# Patient Record
Sex: Male | Born: 1940 | Race: White | Hispanic: No | Marital: Single | Smoking: Current every day smoker
Health system: Southern US, Community
[De-identification: ages and names within clinical notes are randomized; demographics above are authoritative.]

## PROBLEM LIST (undated history)

## (undated) DIAGNOSIS — Z89619 Acquired absence of unspecified leg above knee: Secondary | ICD-10-CM

## (undated) DIAGNOSIS — H269 Unspecified cataract: Secondary | ICD-10-CM

## (undated) DIAGNOSIS — B351 Tinea unguium: Secondary | ICD-10-CM

## (undated) DIAGNOSIS — K210 Gastro-esophageal reflux disease with esophagitis: Secondary | ICD-10-CM

## (undated) DIAGNOSIS — Z95810 Presence of automatic (implantable) cardiac defibrillator: Secondary | ICD-10-CM

## (undated) DIAGNOSIS — I480 Paroxysmal atrial fibrillation: Secondary | ICD-10-CM

## (undated) DIAGNOSIS — E119 Type 2 diabetes mellitus without complications: Secondary | ICD-10-CM

## (undated) DIAGNOSIS — N189 Chronic kidney disease, unspecified: Secondary | ICD-10-CM

## (undated) DIAGNOSIS — I447 Left bundle-branch block, unspecified: Secondary | ICD-10-CM

## (undated) DIAGNOSIS — I10 Essential (primary) hypertension: Secondary | ICD-10-CM

## (undated) DIAGNOSIS — Z5181 Encounter for therapeutic drug level monitoring: Secondary | ICD-10-CM

## (undated) DIAGNOSIS — I739 Peripheral vascular disease, unspecified: Secondary | ICD-10-CM

## (undated) DIAGNOSIS — J449 Chronic obstructive pulmonary disease, unspecified: Secondary | ICD-10-CM

## (undated) DIAGNOSIS — N183 Chronic kidney disease, stage 3 (moderate): Secondary | ICD-10-CM

## (undated) DIAGNOSIS — E785 Hyperlipidemia, unspecified: Secondary | ICD-10-CM

## (undated) DIAGNOSIS — E079 Disorder of thyroid, unspecified: Secondary | ICD-10-CM

## (undated) DIAGNOSIS — E876 Hypokalemia: Secondary | ICD-10-CM

## (undated) DIAGNOSIS — I779 Disorder of arteries and arterioles, unspecified: Secondary | ICD-10-CM

## (undated) DIAGNOSIS — K219 Gastro-esophageal reflux disease without esophagitis: Secondary | ICD-10-CM

## (undated) DIAGNOSIS — M1990 Unspecified osteoarthritis, unspecified site: Secondary | ICD-10-CM

## (undated) DIAGNOSIS — I70229 Atherosclerosis of native arteries of extremities with rest pain, unspecified extremity: Secondary | ICD-10-CM

## (undated) DIAGNOSIS — I6529 Occlusion and stenosis of unspecified carotid artery: Secondary | ICD-10-CM

## (undated) DIAGNOSIS — E1142 Type 2 diabetes mellitus with diabetic polyneuropathy: Secondary | ICD-10-CM

## (undated) DIAGNOSIS — E039 Hypothyroidism, unspecified: Secondary | ICD-10-CM

## (undated) DIAGNOSIS — Z952 Presence of prosthetic heart valve: Secondary | ICD-10-CM

## (undated) DIAGNOSIS — Z7901 Long term (current) use of anticoagulants: Secondary | ICD-10-CM

## (undated) DIAGNOSIS — I25119 Atherosclerotic heart disease of native coronary artery with unspecified angina pectoris: Secondary | ICD-10-CM

## (undated) DIAGNOSIS — I130 Hypertensive heart and chronic kidney disease with heart failure and stage 1 through stage 4 chronic kidney disease, or unspecified chronic kidney disease: Secondary | ICD-10-CM

## (undated) DIAGNOSIS — I5022 Chronic systolic (congestive) heart failure: Secondary | ICD-10-CM

## (undated) DIAGNOSIS — R64 Cachexia: Secondary | ICD-10-CM

## (undated) HISTORY — DX: Chronic obstructive pulmonary disease, unspecified: J44.9

## (undated) HISTORY — DX: Atherosclerotic heart disease of native coronary artery with unspecified angina pectoris: I25.119

## (undated) HISTORY — PX: EP IMPLANTABLE DEVICE: SHX172B

## (undated) HISTORY — DX: Disorder of arteries and arterioles, unspecified: I77.9

## (undated) HISTORY — DX: Disorder of thyroid, unspecified: E07.9

## (undated) HISTORY — DX: Long term (current) use of anticoagulants: Z79.01

## (undated) HISTORY — DX: Chronic kidney disease, stage 3 (moderate): N18.3

## (undated) HISTORY — DX: Type 2 diabetes mellitus without complications: E11.9

## (undated) HISTORY — DX: Presence of prosthetic heart valve: Z95.2

## (undated) HISTORY — DX: Gastro-esophageal reflux disease without esophagitis: K21.9

## (undated) HISTORY — DX: Hypertensive heart and chronic kidney disease with heart failure and stage 1 through stage 4 chronic kidney disease, or unspecified chronic kidney disease: I13.0

## (undated) HISTORY — DX: Essential (primary) hypertension: I10

## (undated) HISTORY — DX: Left bundle-branch block, unspecified: I44.7

## (undated) HISTORY — DX: Paroxysmal atrial fibrillation: I48.0

## (undated) HISTORY — DX: Occlusion and stenosis of unspecified carotid artery: I65.29

## (undated) HISTORY — DX: Hyperlipidemia, unspecified: E78.5

## (undated) HISTORY — PX: CORONARY ARTERY BYPASS GRAFT: SHX141

## (undated) HISTORY — PX: PACEMAKER INSERTION: SHX728

## (undated) HISTORY — DX: Chronic systolic (congestive) heart failure: I50.22

## (undated) HISTORY — DX: Encounter for therapeutic drug level monitoring: Z51.81

## (undated) HISTORY — DX: Hypokalemia: E87.6

## (undated) HISTORY — DX: Type 2 diabetes mellitus with diabetic polyneuropathy: E11.42

## (undated) HISTORY — DX: Gastro-esophageal reflux disease with esophagitis: K21.0

## (undated) HISTORY — DX: Peripheral vascular disease, unspecified: I73.9

## (undated) HISTORY — DX: Chronic kidney disease, unspecified: N18.9

## (undated) HISTORY — DX: Hypothyroidism, unspecified: E03.9

## (undated) HISTORY — DX: Acquired absence of unspecified leg above knee: Z89.619

## (undated) HISTORY — DX: Atherosclerosis of native arteries of extremities with rest pain, unspecified extremity: I70.229

## (undated) HISTORY — DX: Cachexia: R64

## (undated) HISTORY — DX: Tinea unguium: B35.1

## (undated) HISTORY — DX: Unspecified osteoarthritis, unspecified site: M19.90

## (undated) HISTORY — PX: CARDIAC VALVE REPLACEMENT: SHX585

## (undated) HISTORY — DX: Unspecified cataract: H26.9

## (undated) HISTORY — DX: Presence of automatic (implantable) cardiac defibrillator: Z95.810

---

## 1997-12-14 ENCOUNTER — Inpatient Hospital Stay (HOSPITAL_COMMUNITY): Admission: AD | Admit: 1997-12-14 | Discharge: 1997-12-23 | Payer: Self-pay | Admitting: Cardiovascular Disease

## 1998-05-07 ENCOUNTER — Inpatient Hospital Stay (HOSPITAL_COMMUNITY): Admission: RE | Admit: 1998-05-07 | Discharge: 1998-05-12 | Payer: Self-pay | Admitting: *Deleted

## 1998-05-08 ENCOUNTER — Encounter: Payer: Self-pay | Admitting: *Deleted

## 1998-11-07 ENCOUNTER — Inpatient Hospital Stay (HOSPITAL_COMMUNITY): Admission: RE | Admit: 1998-11-07 | Discharge: 1998-11-16 | Payer: Self-pay | Admitting: *Deleted

## 1998-11-09 ENCOUNTER — Encounter: Payer: Self-pay | Admitting: *Deleted

## 1998-11-10 ENCOUNTER — Encounter: Payer: Self-pay | Admitting: *Deleted

## 1998-11-11 ENCOUNTER — Encounter: Payer: Self-pay | Admitting: *Deleted

## 1999-07-16 HISTORY — PX: OTHER SURGICAL HISTORY: SHX169

## 1999-07-16 HISTORY — PX: BELOW KNEE LEG AMPUTATION: SUR23

## 2004-06-01 ENCOUNTER — Ambulatory Visit: Payer: Self-pay | Admitting: Family Medicine

## 2004-07-12 ENCOUNTER — Ambulatory Visit: Payer: Self-pay | Admitting: Family Medicine

## 2004-07-26 ENCOUNTER — Ambulatory Visit: Payer: Self-pay | Admitting: Family Medicine

## 2004-08-10 ENCOUNTER — Ambulatory Visit: Payer: Self-pay | Admitting: Family Medicine

## 2004-08-24 ENCOUNTER — Ambulatory Visit: Payer: Self-pay | Admitting: Family Medicine

## 2004-09-25 ENCOUNTER — Ambulatory Visit: Payer: Self-pay | Admitting: Family Medicine

## 2004-10-10 ENCOUNTER — Ambulatory Visit: Payer: Self-pay | Admitting: Family Medicine

## 2004-11-08 ENCOUNTER — Ambulatory Visit: Payer: Self-pay | Admitting: Family Medicine

## 2004-11-22 ENCOUNTER — Ambulatory Visit: Payer: Self-pay | Admitting: Family Medicine

## 2004-12-11 ENCOUNTER — Ambulatory Visit: Payer: Self-pay | Admitting: Family Medicine

## 2004-12-31 ENCOUNTER — Ambulatory Visit: Payer: Self-pay | Admitting: Family Medicine

## 2005-01-22 ENCOUNTER — Ambulatory Visit: Payer: Self-pay | Admitting: Family Medicine

## 2005-02-12 ENCOUNTER — Ambulatory Visit: Payer: Self-pay | Admitting: Family Medicine

## 2005-02-28 ENCOUNTER — Ambulatory Visit: Payer: Self-pay | Admitting: Family Medicine

## 2005-04-01 ENCOUNTER — Ambulatory Visit: Payer: Self-pay | Admitting: Family Medicine

## 2005-04-18 ENCOUNTER — Ambulatory Visit: Payer: Self-pay | Admitting: Family Medicine

## 2005-05-02 ENCOUNTER — Ambulatory Visit: Payer: Self-pay | Admitting: Family Medicine

## 2005-05-20 ENCOUNTER — Ambulatory Visit: Payer: Self-pay | Admitting: Family Medicine

## 2005-06-03 ENCOUNTER — Ambulatory Visit: Payer: Self-pay | Admitting: Family Medicine

## 2005-06-18 ENCOUNTER — Ambulatory Visit: Payer: Self-pay | Admitting: Family Medicine

## 2005-07-03 ENCOUNTER — Ambulatory Visit: Payer: Self-pay | Admitting: Family Medicine

## 2005-07-18 ENCOUNTER — Ambulatory Visit: Payer: Self-pay | Admitting: Family Medicine

## 2005-08-14 ENCOUNTER — Ambulatory Visit: Payer: Self-pay | Admitting: Family Medicine

## 2005-09-11 ENCOUNTER — Ambulatory Visit: Payer: Self-pay | Admitting: Family Medicine

## 2005-09-25 ENCOUNTER — Ambulatory Visit: Payer: Self-pay | Admitting: Family Medicine

## 2005-10-10 ENCOUNTER — Ambulatory Visit: Payer: Self-pay | Admitting: Family Medicine

## 2009-05-09 ENCOUNTER — Ambulatory Visit: Payer: Self-pay | Admitting: Vascular Surgery

## 2010-11-27 NOTE — Procedures (Signed)
CAROTID DUPLEX EXAM   INDICATION:  History of left carotid endarterectomy with recent TIA.   HISTORY:  Diabetes:  no  Cardiac:  yes  Hypertension:  yes  Smoking:  yes  Previous Surgery:  Left carotid endarterectomy.  CV History:  no  Amaurosis Fugax No, Paresthesias No, Hemiparesis No                                       RIGHT             LEFT  Brachial systolic pressure:         150               147  Brachial Doppler waveforms:  Vertebral direction of flow:        antegrade         Not visualized  DUPLEX VELOCITIES (cm/sec)  CCA peak systolic                   80                93  ECA peak systolic                   288               518  ICA peak systolic                   125               111  ICA end diastolic                   34                31  PLAQUE MORPHOLOGY:                  heterogenous      heterogenous  PLAQUE AMOUNT:                      moderate          moderate  PLAQUE LOCATION:                    ICA/ECA           ICA/ECA   IMPRESSION:  1. 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  2. Antegrade right vertebral artery.        ___________________________________________  Quita Skye Hart [place], M.D.   MG/MEDQ  D:  05/09/2009  T:  05/10/2009  Job:  [id number]

## 2010-11-27 NOTE — Consult Note (Signed)
NEW PATIENT CONSULTATION   Craig Dixon, GELL  DOB:  14-Mar-1941                                       05/09/2009  ZOXWR#:[id number]   The patient was referred today for a history of recent left brain TIA  and a history of carotid occlusive disease.  The patient has a history  of left carotid endarterectomy performed by Dr. Liliane Bade in 1999 prior  to coronary artery bypass grafting by Dr. Kathlee Nations Trigt.  He has had  no neurologic symptoms since that time but recently had 3 episodes of  right hand and right facial weakness as well as some garbled speech.  Each of these lasted only a few minutes and were not permanent.  He was  referred for further evaluation of this.  He has no history of stroke,  amaurosis fugax or other symptoms.   PAST MEDICAL HISTORY:  1. Diabetes mellitus - stable.  2. Hypertension - stable.  3. Coronary artery disease, previous coronary artery bypass grafting -      stable.  4. History of COPD and congestive heart failure.   PAST SURGICAL HISTORY:  1. Aortic valve replacement and coronary artery bypass grafting.  2. Left carotid endarterectomy.  3. Right femoral popliteal bypass graft with Gore-Tex.  4. Right above knee amputation.   FAMILY HISTORY:  Positive for coronary artery disease in his father.  Negative for diabetes and stroke.   SOCIAL HISTORY:  He is married, has 4 children.  Smoked a pack and a  half of cigarettes a day for 50 years and does not use alcohol.   REVIEW OF SYSTEMS:  Denies any chest pain, dyspnea on exertion,  bronchitis, hemoptysis.  Does have a history of hiatal hernia,  constipation, left brain TIA as noted and a right above knee amputation.  All other systems are negative.  Please see encounter form.   ALLERGIES:  Talwin.   PHYSICAL EXAMINATION:  Vital signs:  Blood pressure 150/75, heart rate  54, respirations 14.  General:  He is an elderly chronically ill-  appearing male in no apparent distress,  alert and oriented x3.  Neck:  Supple, 3+ carotid pulses palpable.  Soft bruits are present  bilaterally.  Neurologic:  Normal.  No palpable adenopathy in the neck.  HEENT:  Exam is normal.  Chest:  Clear to auscultation.  Cardiovascular:  Regular rhythm.  No murmurs.  Abdomen:  Soft, nontender with no masses.  His right above knee amputation is well-healed.  Left leg has a 2+  femoral pulse.  No evidence of ischemia distally.   He had an MRA exam performed as well as an ultrasound performed prior to  arrival today which I have reviewed.  I have also reviewed his old  records.  I did order a repeat carotid duplex exam today to look for  ulceration of his left carotid bifurcation at the site of the previous  endarterectomy.   I interpreted that and it reveals no evidence of significant stenosis in  the left internal carotid or common carotid artery.  He does have a  severe left external carotid stenosis.  There is mild right internal  carotid flow reduction.   I do not think there is evidence to support the left carotid site as the  source of the recent TIAs.  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  If further TIAs  occur then we should consider cerebral angiography at that time but I do  not think that would be indicated at the present time.  Please let us  know if he has further specific left brain symptoms.   Quita Skye Hart [place], M.D.  Electronically Signed   JDL/MEDQ  D:  05/09/2009  T:  05/10/2009  Job:  [id number]   cc:   Elease Hashimoto A. Benedetto Goad, M.D.

## 2012-05-19 ENCOUNTER — Encounter: Payer: Self-pay | Admitting: Vascular Surgery

## 2013-02-12 HISTORY — PX: OTHER SURGICAL HISTORY: SHX169

## 2014-12-01 ENCOUNTER — Ambulatory Visit: Payer: Medicare Other | Admitting: Podiatrist

## 2014-12-19 ENCOUNTER — Ambulatory Visit: Payer: Medicare Other | Admitting: Podiatry

## 2014-12-26 ENCOUNTER — Ambulatory Visit (INDEPENDENT_AMBULATORY_CARE_PROVIDER_SITE_OTHER): Payer: Medicare Other | Admitting: Podiatry

## 2014-12-26 ENCOUNTER — Encounter: Payer: Self-pay | Admitting: Podiatry

## 2014-12-26 VITALS — BP 165/74 | HR 79 | Resp 18

## 2014-12-26 DIAGNOSIS — M79676 Pain in unspecified toe(s): Secondary | ICD-10-CM | POA: Diagnosis not present

## 2014-12-26 DIAGNOSIS — B351 Tinea unguium: Secondary | ICD-10-CM | POA: Diagnosis not present

## 2014-12-26 DIAGNOSIS — E1140 Type 2 diabetes mellitus with diabetic neuropathy, unspecified: Secondary | ICD-10-CM

## 2014-12-26 DIAGNOSIS — I739 Peripheral vascular disease, unspecified: Secondary | ICD-10-CM

## 2014-12-26 DIAGNOSIS — M79674 Pain in right toe(s): Secondary | ICD-10-CM

## 2014-12-26 NOTE — Progress Notes (Signed)
   Subjective:    Patient ID: Craig Dixon, male    DOB: May 09, 1941, 74 y.o.   MRN: [id number]  HPI i have some toenails that need to be looked at and my foot checked and i am a diabetic and i do take coumadin and my diabetes is controlled by diet and lost my right leg to vascular issues  HPI  Complaint:  Visit Type: Patient returns to my office for continued preventative foot care services. Complaint: Patient states" my nails have grown long and thick and become painful to walk and wear shoes" Patient has been diagnosed with DM with neuropathy and angiopathy.  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 He presents for preventative foot care services.   Podiatric Exam: Vascular: dorsalis pedis and posterior tibial pulses are negative. Capillary return is diminished Temperature gradient is diminished.  All these findings are left foot. Sensorium: Absent Semmes Weinstein monofilament test.   Nail Exam: Pt has thick disfigured discolored nails with subungual debris noted leftl entire nail hallux through fifth toenails Ulcer Exam: There is no evidence of ulcer or pre-ulcerative changes or infection. Orthopedic Exam: Muscle tone and strength are WNL. No limitations in general ROM. No crepitus or effusions noted. Foot type and digits show no abnormalities. Bony prominences are unremarkable. Skin: No Porokeratosis. No infection or ulcers  Diagnosis:  Tinea unguium, , pain in left toes  Treatment & Plan Procedures and Treatment: Consent by patient was obtained for treatment procedures. The patient understood the discussion of treatment and procedures well. All questions were answered thoroughly reviewed. Debridement of mycotic and hypertrophic toenails, 1 through 5 bilateral and clearing of subungual debris. No ulceration, no infection noted.  Return Visit-Office Procedure: Patient instructed to return to the office for a follow up visit 3 months for continued evaluation and  treatment.   Review of Systems  Cardiovascular:       Poor circulation and peripheral artery disease  Musculoskeletal:       Muscle pain and swelling   Skin:       Change in nails  Hematological:       Slow to heal  All other systems reviewed and are negative.      Objective:   Physical Exam        Assessment & Plan:

## 2015-01-18 DIAGNOSIS — I25119 Atherosclerotic heart disease of native coronary artery with unspecified angina pectoris: Secondary | ICD-10-CM | POA: Insufficient documentation

## 2015-01-18 DIAGNOSIS — I6529 Occlusion and stenosis of unspecified carotid artery: Secondary | ICD-10-CM

## 2015-01-18 DIAGNOSIS — N183 Chronic kidney disease, stage 3 unspecified: Secondary | ICD-10-CM

## 2015-01-18 DIAGNOSIS — I130 Hypertensive heart and chronic kidney disease with heart failure and stage 1 through stage 4 chronic kidney disease, or unspecified chronic kidney disease: Secondary | ICD-10-CM

## 2015-01-18 DIAGNOSIS — Z952 Presence of prosthetic heart valve: Secondary | ICD-10-CM

## 2015-01-18 DIAGNOSIS — I5022 Chronic systolic (congestive) heart failure: Secondary | ICD-10-CM | POA: Insufficient documentation

## 2015-01-18 HISTORY — DX: Chronic kidney disease, stage 3 unspecified: N18.30

## 2015-01-18 HISTORY — DX: Chronic systolic (congestive) heart failure: I50.22

## 2015-01-18 HISTORY — DX: Hypertensive heart and chronic kidney disease with heart failure and stage 1 through stage 4 chronic kidney disease, or unspecified chronic kidney disease: I13.0

## 2015-01-18 HISTORY — DX: Occlusion and stenosis of unspecified carotid artery: I65.29

## 2015-01-18 HISTORY — DX: Atherosclerotic heart disease of native coronary artery with unspecified angina pectoris: I25.119

## 2015-01-18 HISTORY — DX: Presence of prosthetic heart valve: Z95.2

## 2015-03-06 ENCOUNTER — Encounter: Payer: Self-pay | Admitting: Podiatry

## 2015-03-06 ENCOUNTER — Ambulatory Visit (INDEPENDENT_AMBULATORY_CARE_PROVIDER_SITE_OTHER): Payer: Medicare Other | Admitting: Podiatry

## 2015-03-06 DIAGNOSIS — B351 Tinea unguium: Secondary | ICD-10-CM

## 2015-03-06 DIAGNOSIS — M79676 Pain in unspecified toe(s): Secondary | ICD-10-CM

## 2015-03-06 DIAGNOSIS — E1140 Type 2 diabetes mellitus with diabetic neuropathy, unspecified: Secondary | ICD-10-CM

## 2015-03-06 DIAGNOSIS — M79675 Pain in left toe(s): Secondary | ICD-10-CM

## 2015-03-06 NOTE — Progress Notes (Signed)
Patient ID: Craig Dixon, male   DOB: January 07, 1941, 74 y.o.   MRN: [id number] HPI  Complaint:  Visit Type: Patient returns to my office for continued preventative foot care services. Complaint: Patient states" my nails have grown long and thick and become painful to walk and wear shoes" Patient has been diagnosed with DM with neuropathy and angiopathy,This patient  presents for preventative foot care services. No changes to ROS.  Amputation right leg noted.  Podiatric Exam: Vascular: dorsalis pedis and posterior tibial pulses are non-palpable left foot.. Capillary return is diminished. Cold feet noted.Owens Shark discolored skin left foot. Due to venous disease. Sensorium: Absent Semmes Weinstein monofilament test. .  Nail Exam: Pt has thick disfigured discolored nails with subungual debris noted left foot  entire nail hallux through fifth toenails Ulcer Exam: There is no evidence of ulcer or pre-ulcerative changes or infection. Orthopedic Exam: Muscle tone and strength are WNL. No limitations in general ROM. No crepitus or effusions noted. Foot type and digits show no abnormalities. Bony prominences are unremarkable. Skin: No Porokeratosis. No infection or ulcers.  Asymptomatic callus left feel retrocalcaneally.  Diagnosis:  Onychomycosis, pain in left toes  Treatment & Plan Procedures and Treatment: Consent by patient was obtained for treatment procedures. The patient understood the discussion of treatment and procedures well. All questions were answered thoroughly reviewed. Debridement of mycotic and hypertrophic toenails, 1 through 5 left foot and clearing of subungual debris. No ulceration, no infection noted.  Return Visit-Office Procedure: Patient instructed to return to the office for a follow up visit 3 months for continued evaluation and treatment.

## 2015-03-29 ENCOUNTER — Ambulatory Visit: Payer: Medicare Other | Admitting: Podiatry

## 2015-05-08 ENCOUNTER — Encounter: Payer: Self-pay | Admitting: Podiatry

## 2015-05-08 ENCOUNTER — Ambulatory Visit (INDEPENDENT_AMBULATORY_CARE_PROVIDER_SITE_OTHER): Payer: Medicare Other | Admitting: Podiatry

## 2015-05-08 DIAGNOSIS — I739 Peripheral vascular disease, unspecified: Secondary | ICD-10-CM

## 2015-05-08 DIAGNOSIS — M79676 Pain in unspecified toe(s): Secondary | ICD-10-CM | POA: Diagnosis not present

## 2015-05-08 DIAGNOSIS — B351 Tinea unguium: Secondary | ICD-10-CM

## 2015-05-08 DIAGNOSIS — E1140 Type 2 diabetes mellitus with diabetic neuropathy, unspecified: Secondary | ICD-10-CM

## 2015-05-08 DIAGNOSIS — M79675 Pain in left toe(s): Secondary | ICD-10-CM

## 2015-05-08 NOTE — Progress Notes (Signed)
Patient ID: Craig Dixon, male   DOB: 1940/07/25, 74 y.o.   MRN: [id number] HPI  Complaint:  Visit Type: Patient returns to my office for continued preventative foot care services. Complaint: Patient states" my nails have grown long and thick and become painful to walk and wear shoes" Patient has been diagnosed with DM with neuropathy and angiopathy,This patient  presents for preventative foot care services. No changes to ROS.  Amputation right leg noted.  Podiatric Exam: Vascular: dorsalis pedis and posterior tibial pulses are non-palpable left foot.. Capillary return is diminished. Cold feet noted.Owens Shark discolored skin left foot. Due to venous disease. Sensorium: Absent Semmes Weinstein monofilament test. .  Nail Exam: Pt has thick disfigured discolored nails with subungual debris noted left foot  entire nail hallux through fifth toenails Ulcer Exam: There is no evidence of ulcer or pre-ulcerative changes or infection. Orthopedic Exam: Muscle tone and strength are WNL. No limitations in general ROM. No crepitus or effusions noted. Foot type and digits show no abnormalities. Bony prominences are unremarkable. Skin: No Porokeratosis. No infection or ulcers.  Asymptomatic callus left feel retrocalcaneally.  Diagnosis:  Onychomycosis, pain in left toes  Treatment & Plan Procedures and Treatment: Consent by patient was obtained for treatment procedures. The patient understood the discussion of treatment and procedures well. All questions were answered thoroughly reviewed. Debridement of mycotic and hypertrophic toenails, 1 through 5 left foot and clearing of subungual debris. No ulceration, no infection noted. If this condition worsens or becomes very painful, the patient was told to contact this office or go to the Emergency Department at the hospital. Return Visit-Office Procedure: Patient instructed to return to the office for a follow up visit 3 months for continued evaluation and  treatment.

## 2015-07-12 DIAGNOSIS — I739 Peripheral vascular disease, unspecified: Secondary | ICD-10-CM

## 2015-07-12 HISTORY — DX: Peripheral vascular disease, unspecified: I73.9

## 2015-07-31 ENCOUNTER — Ambulatory Visit: Payer: Medicare Other | Admitting: Podiatry

## 2015-08-01 ENCOUNTER — Encounter: Payer: Self-pay | Admitting: Vascular Surgery

## 2015-08-02 ENCOUNTER — Telehealth: Payer: Self-pay | Admitting: Podiatry

## 2015-08-02 NOTE — Telephone Encounter (Signed)
Received call from daughter to cancel appt. No appt found

## 2015-08-09 ENCOUNTER — Ambulatory Visit (INDEPENDENT_AMBULATORY_CARE_PROVIDER_SITE_OTHER): Payer: Medicare Other | Admitting: Vascular Surgery

## 2015-08-09 ENCOUNTER — Encounter: Payer: Self-pay | Admitting: Vascular Surgery

## 2015-08-09 ENCOUNTER — Other Ambulatory Visit: Payer: Self-pay

## 2015-08-09 VITALS — BP 172/89 | HR 63 | Temp 97.5°F | Resp 16 | Ht 68.0 in | Wt 125.0 lb

## 2015-08-09 DIAGNOSIS — I70222 Atherosclerosis of native arteries of extremities with rest pain, left leg: Secondary | ICD-10-CM | POA: Diagnosis not present

## 2015-08-09 NOTE — Progress Notes (Signed)
Referred by:  Richardo Priest, MD [address] [place], [place] [address]  Reason for referral: left leg rest pain  History of Present Illness  Craig Dixon is a 75 y.o. (1940-10-09) male s/p R AKA and L CEA (Dr. Amedeo Plenty) who presents with chief complaint: left lower leg rest pain.  Onset of symptom occurred 1-2 month again.  Pain is described as aching, severity 5-10/10, and associated even with rest.  Pt wakes at night time consistently, having to lower his leg to get some relief.  This patient was referred from his cardiologist who has been longitudinally managing his PAD, as he has been lost to follow up from this practice.  Atherosclerotic risk factors include: HTN, DM, and active smoking.  Past Medical History  Diagnosis Date  . Cataract   . Arthritis   . Diabetes mellitus without complication ([place])   . Thyroid disease   . Hypertension   . Chronic kidney disease   . COPD (chronic obstructive pulmonary disease) ([place])   . GERD (gastroesophageal reflux disease)     Past Surgical History  Procedure Laterality Date  . Pace maker  Aug. 2014  . Leg ampuation  2001    right leg  . Cardiac valve replacement      Social History   Social History  . Marital Status: Single    Spouse Name: N/A  . Number of Children: N/A  . Years of Education: N/A   Occupational History  . Not on file.   Social History Main Topics  . Smoking status: Current Every Day Smoker  . Smokeless tobacco: Never Used  . Alcohol Use: No  . Drug Use: No  . Sexual Activity: Not on file   Other Topics Concern  . Not on file   Social History Narrative    Family History: patient is unable to detail the medical history of his parents   Current Outpatient Prescriptions  Medication Sig Dispense Refill  . clotrimazole (LOTRIMIN) 1 % cream Apply 1 application topically 2 (two) times daily.    Marland Kitchen COUMADIN 2.5 MG tablet Take 2.5 mg by mouth daily.  0  . COUMADIN 3 MG tablet Take 3 mg by mouth daily.  0   . polyethylene glycol powder (GLYCOLAX/MIRALAX) powder MIX 17 GRAMS (1 CAPFUL) WITH JUICE OR WATER AND DRINK ONCE DAILY AS NEEDED  2  . amitriptyline (ELAVIL) 50 MG tablet Reported on 08/09/2015  2  . COUMADIN 1 MG tablet     . COUMADIN 2 MG tablet     . furosemide (LASIX) 20 MG tablet Take 40 mg by mouth 2 (two) times daily. Reported on 08/09/2015  5  . furosemide (LASIX) 40 MG tablet Take 80 mg by mouth 2 (two) times daily. Reported on 08/09/2015  3  . HYDROcodone-acetaminophen (NORCO) 10-325 MG per tablet Reported on 08/09/2015  0  . isosorbide mononitrate (IMDUR) 60 MG 24 hr tablet Take 60 mg by mouth daily. Reported on 08/09/2015  4  . levothyroxine (SYNTHROID, LEVOTHROID) 112 MCG tablet Reported on 08/09/2015  2  . lisinopril (PRINIVIL,ZESTRIL) 5 MG tablet Reported on 08/09/2015  6  . metoprolol succinate (TOPROL-XL) 25 MG 24 hr tablet Reported on 08/09/2015  6  . mupirocin ointment (BACTROBAN) 2 %     . omeprazole (PRILOSEC) 20 MG capsule Take 40 mg by mouth daily. Reported on 08/09/2015  2  . Oxycodone HCl 10 MG TABS     . triamcinolone cream (KENALOG) 0.1 % Reported on  08/09/2015  2   No current facility-administered medications for this visit.    Allergies  Allergen Reactions  . Talwin [Pentazocine]      REVIEW OF SYSTEMS:  (Positives checked otherwise negative)  CARDIOVASCULAR:   [ ]  chest pain,  [ ]  chest pressure,  [ ]  palpitations,  [ ]  shortness of breath when laying flat,  [x]  shortness of breath with exertion,   [ ]  pain in feet when walking,  [ ]  pain in feet when laying flat, [ ]  history of blood clot in veins (DVT),  [ ]  history of phlebitis,  [ ]  swelling in legs,  [ ]  varicose veins  PULMONARY:   [ ]  productive cough,  [ ]  asthma,  [ ]  wheezing  NEUROLOGIC:   [x]  weakness in arms or legs,  [x]  numbness in arms or legs,  [ ]  difficulty speaking or slurred speech,  [ ]  temporary loss of vision in one eye,  [ ]  dizziness  HEMATOLOGIC:   [ ]  bleeding  problems,  [ ]  problems with blood clotting too easily  MUSCULOSKEL:   [ ]  joint pain, [ ]  joint swelling  GASTROINTEST:   [ ]  vomiting blood,  [ ]  blood in stool     GENITOURINARY:   [ ]  burning with urination,  [ ]  blood in urine  PSYCHIATRIC:   [ ]  history of major depression  INTEGUMENTARY:   [ ]  rashes,  [ ]  ulcers  CONSTITUTIONAL:   [ ]  fever,  [ ]  chills   For VQI Use Only  PRE-ADM LIVING: Home  AMB STATUS: Ambulatory  CAD Sx: History of MI, but no symptoms No MI within 6 months  PRIOR CHF: None  STRESS TEST: [x]  No, [ ]  Normal, [ ]  + ischemia, [ ]  + MI, [ ]  Both   Physical Examination  Filed Vitals:   08/09/15 1142 08/09/15 1154  BP: 169/79 172/89  Pulse: 65 63  Temp: 97.5 F (36.4 C)   TempSrc: Oral   Resp: 16   Height: 5\' 8"  (1.727 m)   Weight: 125 lb (56.7 kg)   SpO2: 100%    Body mass index is 19.01 kg/(m^2).  General: A&O x 3, WD, cachectic  Head: [place]/AT, Temporalis wasting,   Ear/Nose/Throat: Hearing grossly intact, nares w/o erythema or drainage, oropharynx w/o Erythema/Exudate, Mallampati score: 2  Eyes: PERRLA, EOMI  Neck: Supple, no nuchal rigidity, no palpable LAD  Pulmonary: Sym exp, good air movt, CTAB, no rales, rhonchi, & wheezing  Cardiac: RRR, Nl S1, S2, no rubs or gallops, +murmur  Vascular: Vessel Right Left  Radial Faintly Palpable Faintly Palpable  Brachial Palpable Palpable  Carotid Palpable, without bruit Palpable, with wheezing bruit  Aorta Not palpable N/A  Femoral Palpable Palpable  Popliteal AKA Not palpable  PT AKA Palpable  DP AKA Palpable   Gastrointestinal: soft, NTND, -G/R, - HSM, - masses, - CVAT B  Musculoskeletal: motor testing limited as pt wheelchair bound, ischemic toes in L foot, dependent cyanosis down to mid-foot  Neurologic: CN 2-12 intact grossly intact, Pain and light touch intact in extremities , Motor exam as listed above  Psychiatric: Judgment intact, Mood & affect appropriate  for pt's clinical situation  Dermatologic: See M/S exam for extremity exam, no rashes otherwise noted  Lymph : No Cervical, Axillary, or Inguinal lymphadenopathy    Outside Studies/Documentation 10 pages of outside documents were reviewed including: cardiology notes and outpatient LLE arterial duplex.   Medical Decision Making  Craig Dixon  Craig Dixon is a 75 y.o. 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   His LLE arterial duplex, however, suggests that this likely to be low probability of success given a long lesion in L SFA  I discussed with the patient the natural history of critical limb ischemia: 25% require amputation in one year, 50% are able to maintain their limbs in one year, and 25-30% die in one year due to comorbidities.  Given the limb threatening status of this patient, I recommend an aggressive work up including proceeding with an: Aortogram, Left runoff and possible intervention. I discussed with the patient the nature of angiographic procedures, especially the limited patencies of any endovascular intervention. The patient is aware of that the risks of an angiographic procedure include but are not limited to: bleeding, infection, access site complications, embolization, rupture of treated vessel, dissection, possible need for emergent surgical intervention, and possible need for surgical procedures to treat the patient's pathology. The patient is aware of the risks and agrees to proceed.  The procedure is scheduled for: 2 FEB 17.  I discussed in depth with the patient the nature of atherosclerosis, and emphasized the importance of maximal medical management including strict control of blood pressure, blood glucose, and lipid levels, antiplatelet agents, obtaining regular exercise, and cessation  of smoking.  The patient is aware that without maximal medical management the underlying atherosclerotic disease process will progress, limiting the benefit of any interventions. The patient is currently not on a statin:  As not medically indicated. The patient is currently not on an anti-platelet: as on anticoagulation.  Thank you for allowing [place] to participate in this patient's care.   Adele Barthel, MD [facility] Office: [phone] Pager: [phone]  08/09/2015, 2:04 PM

## 2015-08-15 ENCOUNTER — Encounter: Payer: Self-pay | Admitting: Vascular Surgery

## 2015-08-17 ENCOUNTER — Ambulatory Visit (HOSPITAL_COMMUNITY)
Admission: RE | Admit: 2015-08-17 | Discharge: 2015-08-17 | Disposition: A | Discharge status: Home / Self Care | Payer: Medicare Other | Source: Ambulatory Visit | Attending: Vascular Surgery | Admitting: Vascular Surgery

## 2015-08-17 ENCOUNTER — Encounter (HOSPITAL_COMMUNITY)
Admission: RE | Disposition: A | Discharge status: Home / Self Care | Payer: Self-pay | Source: Ambulatory Visit | Attending: Vascular Surgery

## 2015-08-17 ENCOUNTER — Encounter (HOSPITAL_COMMUNITY): Payer: Self-pay | Admitting: Vascular Surgery

## 2015-08-17 DIAGNOSIS — J449 Chronic obstructive pulmonary disease, unspecified: Secondary | ICD-10-CM | POA: Diagnosis not present

## 2015-08-17 DIAGNOSIS — Z952 Presence of prosthetic heart valve: Secondary | ICD-10-CM | POA: Insufficient documentation

## 2015-08-17 DIAGNOSIS — F17210 Nicotine dependence, cigarettes, uncomplicated: Secondary | ICD-10-CM | POA: Insufficient documentation

## 2015-08-17 DIAGNOSIS — Z89611 Acquired absence of right leg above knee: Secondary | ICD-10-CM | POA: Insufficient documentation

## 2015-08-17 DIAGNOSIS — E119 Type 2 diabetes mellitus without complications: Secondary | ICD-10-CM | POA: Diagnosis not present

## 2015-08-17 DIAGNOSIS — I70229 Atherosclerosis of native arteries of extremities with rest pain, unspecified extremity: Secondary | ICD-10-CM

## 2015-08-17 DIAGNOSIS — Z7901 Long term (current) use of anticoagulants: Secondary | ICD-10-CM | POA: Insufficient documentation

## 2015-08-17 DIAGNOSIS — K219 Gastro-esophageal reflux disease without esophagitis: Secondary | ICD-10-CM | POA: Insufficient documentation

## 2015-08-17 DIAGNOSIS — M1990 Unspecified osteoarthritis, unspecified site: Secondary | ICD-10-CM | POA: Insufficient documentation

## 2015-08-17 DIAGNOSIS — I70222 Atherosclerosis of native arteries of extremities with rest pain, left leg: Secondary | ICD-10-CM | POA: Insufficient documentation

## 2015-08-17 DIAGNOSIS — I129 Hypertensive chronic kidney disease with stage 1 through stage 4 chronic kidney disease, or unspecified chronic kidney disease: Secondary | ICD-10-CM | POA: Insufficient documentation

## 2015-08-17 DIAGNOSIS — E079 Disorder of thyroid, unspecified: Secondary | ICD-10-CM | POA: Insufficient documentation

## 2015-08-17 DIAGNOSIS — N189 Chronic kidney disease, unspecified: Secondary | ICD-10-CM | POA: Diagnosis not present

## 2015-08-17 HISTORY — DX: Atherosclerosis of native arteries of extremities with rest pain, unspecified extremity: I70.229

## 2015-08-17 HISTORY — PX: PERIPHERAL VASCULAR CATHETERIZATION: SHX172C

## 2015-08-17 LAB — POCT I-STAT, CHEM 8
BUN: 61 mg/dL — AB (ref 6–20)
CREATININE: 1.8 mg/dL — AB (ref 0.61–1.24)
Calcium, Ion: 1.18 mmol/L (ref 1.13–1.30)
Chloride: 103 mmol/L (ref 101–111)
Glucose, Bld: 129 mg/dL — ABNORMAL HIGH (ref 65–99)
HEMATOCRIT: 44 % (ref 39.0–52.0)
Hemoglobin: 15 g/dL (ref 13.0–17.0)
POTASSIUM: 4.5 mmol/L (ref 3.5–5.1)
Sodium: 138 mmol/L (ref 135–145)
TCO2: 24 mmol/L (ref 0–100)

## 2015-08-17 LAB — GLUCOSE, CAPILLARY: GLUCOSE-CAPILLARY: 112 mg/dL — AB (ref 65–99)

## 2015-08-17 LAB — PROTIME-INR
INR: 1.2 (ref 0.00–1.49)
Prothrombin Time: 15.3 seconds — ABNORMAL HIGH (ref 11.6–15.2)

## 2015-08-17 SURGERY — ABDOMINAL AORTOGRAM W/LOWER EXTREMITY

## 2015-08-17 MED ORDER — HEPARIN (PORCINE) IN NACL 2-0.9 UNIT/ML-% IJ SOLN
INTRAMUSCULAR | Status: AC
  Filled 2015-08-17: qty 1000

## 2015-08-17 MED ORDER — LIDOCAINE HCL (PF) 1 % IJ SOLN
INTRAMUSCULAR | Status: AC
  Filled 2015-08-17: qty 30

## 2015-08-17 MED ORDER — MORPHINE SULFATE (PF) 2 MG/ML IV SOLN
2.0000 mg | INTRAVENOUS | Status: DC | PRN
Start: 2015-08-17 — End: 2015-08-17

## 2015-08-17 MED ORDER — IODIXANOL 320 MG/ML IV SOLN
INTRAVENOUS | Status: DC | PRN
Start: 2015-08-17 — End: 2015-08-17
  Administered 2015-08-17: 15 mL via INTRA_ARTERIAL

## 2015-08-17 MED ORDER — LIDOCAINE HCL (PF) 1 % IJ SOLN
INTRAMUSCULAR | Status: DC | PRN
  Administered 2015-08-17: 13:00:00

## 2015-08-17 MED ORDER — LABETALOL HCL 5 MG/ML IV SOLN
INTRAVENOUS | Status: AC
  Filled 2015-08-17: qty 4

## 2015-08-17 MED ORDER — OXYCODONE-ACETAMINOPHEN 5-325 MG PO TABS: 1.0000 | ORAL_TABLET | ORAL | Status: DC | PRN

## 2015-08-17 MED ORDER — SODIUM CHLORIDE 0.9 % IV SOLN
INTRAVENOUS | Status: DC
  Administered 2015-08-17: 10:00:00 via INTRAVENOUS

## 2015-08-17 MED ORDER — LABETALOL HCL 5 MG/ML IV SOLN
10.0000 mg | INTRAVENOUS | Status: DC | PRN
  Administered 2015-08-17: 10 mg via INTRAVENOUS

## 2015-08-17 MED ORDER — MORPHINE SULFATE (PF) 2 MG/ML IV SOLN
INTRAVENOUS | Status: AC
  Administered 2015-08-17: 2 mg via INTRAVENOUS
  Filled 2015-08-17: qty 1

## 2015-08-17 MED ORDER — SODIUM CHLORIDE 0.9 % IV SOLN: 1.0000 mL/kg/h | INTRAVENOUS | Status: DC

## 2015-08-17 SURGICAL SUPPLY — 14 items
CATH OMNI FLUSH 5F 65CM (CATHETERS) ×2 IMPLANT
COVER PRB 48X5XTLSCP FOLD TPE (BAG) IMPLANT
COVER PROBE 5X48 (BAG) ×3
FILTER CO2 0.2 MICRON (VASCULAR PRODUCTS) ×2 IMPLANT
KIT MICROINTRODUCER STIFF 5F (SHEATH) ×2 IMPLANT
KIT PV (KITS) ×3 IMPLANT
RESERVOIR CO2 (VASCULAR PRODUCTS) ×2 IMPLANT
SET FLUSH CO2 (MISCELLANEOUS) ×3 IMPLANT
SHEATH PINNACLE 5F 10CM (SHEATH) ×3 IMPLANT
SYR MEDRAD MARK V 150ML (SYRINGE) ×3 IMPLANT
TRANSDUCER W/STOPCOCK (MISCELLANEOUS) ×3 IMPLANT
TRAY PV CATH (CUSTOM PROCEDURE TRAY) ×3 IMPLANT
WIRE BENTSON .035X145CM (WIRE) ×2 IMPLANT
WIRE TORQFLEX AUST .018X40CM (WIRE) ×3 IMPLANT

## 2015-08-17 NOTE — Progress Notes (Signed)
Site area: LFA Site Prior to Removal:  Level  0 Pressure Applied For:30 min Manual:   yes Patient Status During Pull:  stable Post Pull Site:  Level 0 Post Pull Instructions Given:  yes Post Pull Pulses Present: doppler Dressing Applied:  clear Bedrest begins @ 1330 till 1730 Comments:

## 2015-08-17 NOTE — Op Note (Signed)
OPERATIVE NOTE   PROCEDURE: 1.  Left common femoral artery cannulation under ultrasound guidance 2.  Placement of catheter in aorta 3.  Aortogram with carbon dioxide 4.  Left leg runoff with carbon dioxide and IV contrast  PRE-OPERATIVE DIAGNOSIS: left leg rest pain  POST-OPERATIVE DIAGNOSIS: same as above   SURGEON: Adele Barthel, MD  ANESTHESIA: conscious sedation  ESTIMATED BLOOD LOSS: 50 cc  CONTRAST: 15 cc IV contrast, 270 cc carbon dioxide   FINDING(S):  Aorta: patent, non-anatomic graft from right side of aorta to left common femoral artery   Poor quality pelvic films due to bowel gas   Left  CFA Only short segment visualized  SFA Occluded  PFA Patent, >75% stenoses in profunda femoral artery collaterals  Pop Reconstitute from profunda femoral artery collaterals  Trif Patent, poor visualized  AT Occluded  Pero Patent, only runoff  PT Occluded   SPECIMEN(S):  none  INDICATIONS:   Craig Dixon is a 75 y.o. 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  The patient presents for: aortogram, left runoff, and possible intervention.  I discussed with the patient the nature of angiographic procedures, especially the limited patencies of any endovascular intervention.  The patient is aware of that the risks of an angiographic procedure include but are not limited to: bleeding, infection, access site complications, renal failure, embolization, rupture of vessel, dissection, possible need for emergent surgical intervention, possible need for surgical procedures to treat the patient's pathology, and stroke and death.  The patient is aware of the risks and agrees to proceed.  DESCRIPTION: After full informed consent was obtained from the patient, the patient was brought back to the angiography suite.  The patient was placed supine upon the angiography table and connected to cardiopulmonary monitoring equipment.  The patient was not  given conscious sedation as I had concerns with this patient's ability to maintain his airway with sedation.  A circulating radiologic technician maintained continuous monitoring of the patient's cardiopulmonary status.  Additionally, the control room radiologic technician provided backup monitoring throughout the procedure.  The patient was prepped and drape in the standard fashion for an angiographic procedure.  At this point, attention was turned to the right groin.  I could not identify a patent right common femoral artery on this side, so I turned my attention to the left groin.  On this side, I suspected there was a graft that accounted for the pulse in this groin.  The patient was unable to confirm this.  Under ultrasound guidance, the subcutaneous tissue surrounding the graft connected to the left common femoral artery was anesthesized with 1% lidocaine with epinephrine.  The graft was then cannulated with a micropuncture needle.  The microwire was advanced into the presumed iliac arterial system.  The needle was exchanged for a microsheath, which was loaded into the graft over the wire.  The microwire was exchanged for a [facility] wire which was advanced into the aorta.  The microsheath was then exchanged for a 5-Fr sheath which was loaded into the graft.  The Omniflush catheter was then loaded over the wire up to the level of L1.  The catheter was connected to the carbon dioxide circuit.  After de-airring and de-clotting the circuit, a carbon dioxide aortogram was completed.  This verified that this was an non-anatomic aorto-unifemoral bypass graft.  I pulled the catheter down to the distal aorta and did another carbon dioxide injection.  This verified  the graft findings.  I replaced the wire into the catheter, straightening out the crook in the catheter.  Both were removed from the sheath together.  I connected the sheath to the carbon dioxide circuit.  I did the left leg runoff in station with the carbon  dioxide.  At this point, I repeated the femoral level injection with contrast.  This verified that the graft was patent, anastomosed to the left common femoral artery with direct drainage into the left profunda femoral artery.  Compromised collaterals from the profunda femoral artery are the likely etiology of his rest pain.  Unfortunately, this patient is not a surgical candidate for surgical bypass per his cardiologist, so he will have to choose between a palliative approach to his left leg vs left above-knee amputation.   COMPLICATIONS: none  CONDITION: stable   Adele Barthel, MD [facility] [place] Office: [phone] Pager: [phone]  08/17/2015, 12:36 PM

## 2015-08-17 NOTE — Progress Notes (Signed)
Pt questions when to start coumadin. I paged Dr Bridgett Larsson, no return call, called on call [facility] Dr Trula Slade, chart was reviewed,  Pt to start coumadin usual dose tonight and call his cardiologist Dr  Elam Dutch tomorrow for coumadin f/u. Pt informed and verbalized understanding.

## 2015-08-17 NOTE — Interval H&P Note (Signed)
[facility] [place]  History and Physical Update  The patient was interviewed and re-examined.  The patient's previous History and Physical has been reviewed and is unchanged from my consult.  There is no change in the plan of care: aortogram, left leg runoff, and possible left leg intervention.  Adele Barthel, MD [facility] [place] Office: [phone] Pager: [phone]  08/17/2015, 8:52 AM

## 2015-08-17 NOTE — Discharge Instructions (Signed)

## 2015-08-17 NOTE — H&P (View-Only) (Signed)
Referred by:  Richardo Priest, MD [address] [place], [place] [address]  Reason for referral: left leg rest pain  History of Present Illness  Craig Dixon is a 75 y.o. (08-13-1940) male s/p R AKA and L CEA (Dr. Amedeo Plenty) who presents with chief complaint: left lower leg rest pain.  Onset of symptom occurred 1-2 month again.  Pain is described as aching, severity 5-10/10, and associated even with rest.  Pt wakes at night time consistently, having to lower his leg to get some relief.  This patient was referred from his cardiologist who has been longitudinally managing his PAD, as he has been lost to follow up from this practice.  Atherosclerotic risk factors include: HTN, DM, and active smoking.  Past Medical History  Diagnosis Date  . Cataract   . Arthritis   . Diabetes mellitus without complication ([place])   . Thyroid disease   . Hypertension   . Chronic kidney disease   . COPD (chronic obstructive pulmonary disease) ([place])   . GERD (gastroesophageal reflux disease)     Past Surgical History  Procedure Laterality Date  . Pace maker  Aug. 2014  . Leg ampuation  2001    right leg  . Cardiac valve replacement      Social History   Social History  . Marital Status: Single    Spouse Name: N/A  . Number of Children: N/A  . Years of Education: N/A   Occupational History  . Not on file.   Social History Main Topics  . Smoking status: Current Every Day Smoker  . Smokeless tobacco: Never Used  . Alcohol Use: No  . Drug Use: No  . Sexual Activity: Not on file   Other Topics Concern  . Not on file   Social History Narrative    Family History: patient is unable to detail the medical history of his parents   Current Outpatient Prescriptions  Medication Sig Dispense Refill  . clotrimazole (LOTRIMIN) 1 % cream Apply 1 application topically 2 (two) times daily.    Marland Kitchen COUMADIN 2.5 MG tablet Take 2.5 mg by mouth daily.  0  . COUMADIN 3 MG tablet Take 3 mg by mouth daily.  0    . polyethylene glycol powder (GLYCOLAX/MIRALAX) powder MIX 17 GRAMS (1 CAPFUL) WITH JUICE OR WATER AND DRINK ONCE DAILY AS NEEDED  2  . amitriptyline (ELAVIL) 50 MG tablet Reported on 08/09/2015  2  . COUMADIN 1 MG tablet     . COUMADIN 2 MG tablet     . furosemide (LASIX) 20 MG tablet Take 40 mg by mouth 2 (two) times daily. Reported on 08/09/2015  5  . furosemide (LASIX) 40 MG tablet Take 80 mg by mouth 2 (two) times daily. Reported on 08/09/2015  3  . HYDROcodone-acetaminophen (NORCO) 10-325 MG per tablet Reported on 08/09/2015  0  . isosorbide mononitrate (IMDUR) 60 MG 24 hr tablet Take 60 mg by mouth daily. Reported on 08/09/2015  4  . levothyroxine (SYNTHROID, LEVOTHROID) 112 MCG tablet Reported on 08/09/2015  2  . lisinopril (PRINIVIL,ZESTRIL) 5 MG tablet Reported on 08/09/2015  6  . metoprolol succinate (TOPROL-XL) 25 MG 24 hr tablet Reported on 08/09/2015  6  . mupirocin ointment (BACTROBAN) 2 %     . omeprazole (PRILOSEC) 20 MG capsule Take 40 mg by mouth daily. Reported on 08/09/2015  2  . Oxycodone HCl 10 MG TABS     . triamcinolone cream (KENALOG) 0.1 % Reported  on 08/09/2015  2   No current facility-administered medications for this visit.    Allergies  Allergen Reactions  . Talwin [Pentazocine]      REVIEW OF SYSTEMS:  (Positives checked otherwise negative)  CARDIOVASCULAR:   [ ]  chest pain,  [ ]  chest pressure,  [ ]  palpitations,  [ ]  shortness of breath when laying flat,  [x]  shortness of breath with exertion,   [ ]  pain in feet when walking,  [ ]  pain in feet when laying flat, [ ]  history of blood clot in veins (DVT),  [ ]  history of phlebitis,  [ ]  swelling in legs,  [ ]  varicose veins  PULMONARY:   [ ]  productive cough,  [ ]  asthma,  [ ]  wheezing  NEUROLOGIC:   [x]  weakness in arms or legs,  [x]  numbness in arms or legs,  [ ]  difficulty speaking or slurred speech,  [ ]  temporary loss of vision in one eye,  [ ]  dizziness  HEMATOLOGIC:   [ ]  bleeding  problems,  [ ]  problems with blood clotting too easily  MUSCULOSKEL:   [ ]  joint pain, [ ]  joint swelling  GASTROINTEST:   [ ]  vomiting blood,  [ ]  blood in stool     GENITOURINARY:   [ ]  burning with urination,  [ ]  blood in urine  PSYCHIATRIC:   [ ]  history of major depression  INTEGUMENTARY:   [ ]  rashes,  [ ]  ulcers  CONSTITUTIONAL:   [ ]  fever,  [ ]  chills   For VQI Use Only  PRE-ADM LIVING: Home  AMB STATUS: Ambulatory  CAD Sx: History of MI, but no symptoms No MI within 6 months  PRIOR CHF: None  STRESS TEST: [x]  No, [ ]  Normal, [ ]  + ischemia, [ ]  + MI, [ ]  Both   Physical Examination  Filed Vitals:   08/09/15 1142 08/09/15 1154  BP: 169/79 172/89  Pulse: 65 63  Temp: 97.5 F (36.4 C)   TempSrc: Oral   Resp: 16   Height: 5\' 8"  (1.727 m)   Weight: 125 lb (56.7 kg)   SpO2: 100%    Body mass index is 19.01 kg/(m^2).  General: A&O x 3, WD, cachectic  Head: [place]/AT, Temporalis wasting,   Ear/Nose/Throat: Hearing grossly intact, nares w/o erythema or drainage, oropharynx w/o Erythema/Exudate, Mallampati score: 2  Eyes: PERRLA, EOMI  Neck: Supple, no nuchal rigidity, no palpable LAD  Pulmonary: Sym exp, good air movt, CTAB, no rales, rhonchi, & wheezing  Cardiac: RRR, Nl S1, S2, no rubs or gallops, +murmur  Vascular: Vessel Right Left  Radial Faintly Palpable Faintly Palpable  Brachial Palpable Palpable  Carotid Palpable, without bruit Palpable, with wheezing bruit  Aorta Not palpable N/A  Femoral Palpable Palpable  Popliteal AKA Not palpable  PT AKA Palpable  DP AKA Palpable   Gastrointestinal: soft, NTND, -G/R, - HSM, - masses, - CVAT B  Musculoskeletal: motor testing limited as pt wheelchair bound, ischemic toes in L foot, dependent cyanosis down to mid-foot  Neurologic: CN 2-12 intact grossly intact, Pain and light touch intact in extremities , Motor exam as listed above  Psychiatric: Judgment intact, Mood & affect appropriate  for pt's clinical situation  Dermatologic: See M/S exam for extremity exam, no rashes otherwise noted  Lymph : No Cervical, Axillary, or Inguinal lymphadenopathy    Outside Studies/Documentation 10 pages of outside documents were reviewed including: cardiology notes and outpatient LLE arterial duplex.   Medical Decision Making  Craig Dixon is a 75 y.o. 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   His LLE arterial duplex, however, suggests that this likely to be low probability of success given a long lesion in L SFA  I discussed with the patient the natural history of critical limb ischemia: 25% require amputation in one year, 50% are able to maintain their limbs in one year, and 25-30% die in one year due to comorbidities.  Given the limb threatening status of this patient, I recommend an aggressive work up including proceeding with an: Aortogram, Left runoff and possible intervention. I discussed with the patient the nature of angiographic procedures, especially the limited patencies of any endovascular intervention. The patient is aware of that the risks of an angiographic procedure include but are not limited to: bleeding, infection, access site complications, embolization, rupture of treated vessel, dissection, possible need for emergent surgical intervention, and possible need for surgical procedures to treat the patient's pathology. The patient is aware of the risks and agrees to proceed.  The procedure is scheduled for: 2 FEB 17.  I discussed in depth with the patient the nature of atherosclerosis, and emphasized the importance of maximal medical management including strict control of blood pressure, blood glucose, and lipid levels, antiplatelet agents, obtaining regular exercise, and cessation  of smoking.  The patient is aware that without maximal medical management the underlying atherosclerotic disease process will progress, limiting the benefit of any interventions. The patient is currently not on a statin:  As not medically indicated. The patient is currently not on an anti-platelet: as on anticoagulation.  Thank you for allowing [place] to participate in this patient's care.   Adele Barthel, MD [facility] [place] Office: [phone] Pager: [phone]  08/09/2015, 2:04 PM

## 2015-08-18 ENCOUNTER — Telehealth: Payer: Self-pay | Admitting: Vascular Surgery

## 2015-08-18 NOTE — Telephone Encounter (Addendum)
-----   Message from Mena Goes, RN sent at 08/17/2015  4:01 PM EST ----- Regarding: schedule   ----- Message -----    From: Conrad New Deal, MD    Sent: 08/17/2015  12:51 PM      To: [address]  KEIJI BARRERE [id number] Jul 04, 1941   PROCEDURE: 1.  Left common femoral artery cannulation under ultrasound guidance 2.  Placement of catheter in aorta 3.  Aortogram with carbon dioxide 4.  Left leg runoff with carbon dioxide and IV contrast  Follow-up: 4-6 weeks   notified patient's daughter of fu appt. with dr. Bridgett Larsson on 09-15-15 at 10:45 am

## 2015-09-07 ENCOUNTER — Encounter: Payer: Self-pay | Admitting: Vascular Surgery

## 2015-09-15 ENCOUNTER — Ambulatory Visit (INDEPENDENT_AMBULATORY_CARE_PROVIDER_SITE_OTHER): Payer: Medicare Other | Admitting: Vascular Surgery

## 2015-09-15 ENCOUNTER — Encounter: Payer: Self-pay | Admitting: Vascular Surgery

## 2015-09-15 VITALS — BP 109/55 | HR 60 | Ht 68.0 in | Wt 125.0 lb

## 2015-09-15 DIAGNOSIS — I70222 Atherosclerosis of native arteries of extremities with rest pain, left leg: Secondary | ICD-10-CM | POA: Diagnosis not present

## 2015-09-15 NOTE — Progress Notes (Signed)
Postoperative Visit   History of Present Illness  Craig Dixon is a 75 y.o. male who presents for postoperative follow-up from procedure on Date: 08/17/15 LRo.  The patient's sx are: None.  He notes recently no rest pain or wound.  The patient has decided manage his non-reconstructable disease with: palliative mgmt first but he is willing to consider amputation if necessary to salvage his life from gangrene or ascending cellulitis.  Again, his functional status is limited to wheelchair.  Past Medical History, Past Surgical History, Social History, Family History, Medications, Allergies, and Review of Systems are unchanged from previous evaluation on 08/17/15.  Current Outpatient Prescriptions  Medication Sig Dispense Refill  . amitriptyline (ELAVIL) 50 MG tablet 50 mg every evening  2  . atorvastatin (LIPITOR) 40 MG tablet Take 40 mg by mouth daily at 6 PM.     . COUMADIN 1 MG tablet Take 0.5 mg by mouth 4 (four) times a week.     Marland Kitchen COUMADIN 3 MG tablet Take 3 mg by mouth daily. 3 mg Mon, Wed, and Fri.  3.5 mg on Tues, Thurs, Sat, Sun  0  . enoxaparin (LOVENOX) 40 MG/0.4ML injection Inject 40 mg into the skin every 12 (twelve) hours.   0  . furosemide (LASIX) 40 MG tablet Take 40 mg by mouth daily. Reported on 08/09/2015  3  . HYDROcodone-acetaminophen (NORCO) 10-325 MG tablet Take 1 tablet by mouth 3 (three) times daily.    . isosorbide mononitrate (IMDUR) 60 MG 24 hr tablet Take 60 mg by mouth daily. Reported on 08/09/2015  4  . levothyroxine (SYNTHROID, LEVOTHROID) 112 MCG tablet Take 112 mcg by mouth daily before breakfast.     . lisinopril (PRINIVIL,ZESTRIL) 5 MG tablet Take 2.5 mg by mouth every Monday, Wednesday, and Friday.     . metoprolol succinate (TOPROL-XL) 25 MG 24 hr tablet Take 25 mg by mouth 4 (four) times a week. 25 mg on Tues, Thurs, Sat, and Sun    . mupirocin ointment (BACTROBAN) 2 % Apply 1 application topically daily as needed (for wound care).     Marland Kitchen omeprazole  (PRILOSEC) 20 MG capsule Take 40 mg by mouth daily. Reported on 08/09/2015  2  . Ostomy Supplies (SKIN PREP WIPES) MISC 1 application by Does not apply route 3 (three) times a week.    Marland Kitchen OVER THE COUNTER MEDICATION Apply 1 application topically daily.    . Oxycodone HCl 10 MG TABS     . polyethylene glycol powder (GLYCOLAX/MIRALAX) powder Take 17 g by mouth at bedtime as needed for mild constipation.     . Wound Dressings (SOLOSITE WOUND GEL) GEL Apply 1 application topically daily as needed (for would care).     No current facility-administered medications for this visit.    For VQI Use Only  PRE-ADM LIVING: Nursing home  AMB STATUS: Wheelchair  Physical Examination  Filed Vitals:   09/15/15 1450  BP: 109/55  Pulse: 60  Height: 5\' 8"  (1.727 m)  Weight: 125 lb (56.7 kg)  SpO2: 98%   Body mass index is 19.01 kg/(m^2).  General: A&O x 3, WD, cachectic  Pulmonary: Sym exp, good air movt, CTAB, no rales, rhonchi, & wheezing  Cardiac: RRR, Nl S1, S2, no rubs or gallops, +murmur  Vascular: Vessel Right Left  Radial Faintly Palpable Faintly Palpable  Brachial Palpable Palpable  Carotid Palpable, without bruit Palpable, with wheezing bruit  Aorta Not palpable N/A  Femoral Palpable Palpable  Popliteal AKA  Not palpable  PT AKA Palpable  DP AKA Palpable   Gastrointestinal: soft, NTND, -G/R, - HSM, - masses, - CVAT B  Musculoskeletal: motor testing limited as pt wheelchair bound, left toes look less ischemic toes, dependent cyanosis down to mid-foot  Neurologic: Pain and light touch intact in extremities , Motor exam as listed above   Medical Decision Making  Craig Dixon is a 75 y.o. male who presents s/p LRo . Based on his angiographic findings, this patient needs: palliative mgmt vs L AKA. I discussed in depth with the patient the nature of atherosclerosis, and emphasized the importance of maximal medical management including strict control  of blood pressure, blood glucose, and lipid levels, obtaining regular exercise, and cessation of smoking.  The patient is aware that without maximal medical management the underlying atherosclerotic disease process will progress, limiting the benefit of any interventions. The patient is currently on a statin: Lipitor.  The patient is currently not  on an anti-platelet due to being on Warfarin.  Feel free to reconsult [place] in the event the patient has intractable rest pain or infectious complications from the left leg.  Thank you for allowing [place] to participate in this patient's care.   Adele Barthel, MD, FACS [facility] Office: [phone] Pager: [phone]  09/15/2015, 12:27 AM

## 2016-01-25 DIAGNOSIS — Z95810 Presence of automatic (implantable) cardiac defibrillator: Secondary | ICD-10-CM

## 2016-01-25 HISTORY — DX: Presence of automatic (implantable) cardiac defibrillator: Z95.810

## 2016-02-18 DIAGNOSIS — Z89619 Acquired absence of unspecified leg above knee: Secondary | ICD-10-CM | POA: Insufficient documentation

## 2016-02-18 DIAGNOSIS — I779 Disorder of arteries and arterioles, unspecified: Secondary | ICD-10-CM

## 2016-02-18 DIAGNOSIS — B351 Tinea unguium: Secondary | ICD-10-CM

## 2016-02-18 HISTORY — DX: Tinea unguium: B35.1

## 2016-02-18 HISTORY — DX: Acquired absence of unspecified leg above knee: Z89.619

## 2016-02-18 HISTORY — DX: Disorder of arteries and arterioles, unspecified: I77.9

## 2016-12-17 DIAGNOSIS — I480 Paroxysmal atrial fibrillation: Secondary | ICD-10-CM

## 2016-12-17 DIAGNOSIS — I482 Chronic atrial fibrillation, unspecified: Secondary | ICD-10-CM | POA: Insufficient documentation

## 2016-12-17 HISTORY — DX: Paroxysmal atrial fibrillation: I48.0

## 2017-02-19 LAB — POCT INR: INR: 2.1

## 2017-02-20 ENCOUNTER — Ambulatory Visit (INDEPENDENT_AMBULATORY_CARE_PROVIDER_SITE_OTHER): Payer: Medicare Other | Admitting: Pharmacist

## 2017-02-20 DIAGNOSIS — Z7901 Long term (current) use of anticoagulants: Secondary | ICD-10-CM

## 2017-02-20 DIAGNOSIS — Z01810 Encounter for preprocedural cardiovascular examination: Secondary | ICD-10-CM | POA: Insufficient documentation

## 2017-02-20 DIAGNOSIS — Z5181 Encounter for therapeutic drug level monitoring: Secondary | ICD-10-CM

## 2017-02-20 HISTORY — DX: Encounter for therapeutic drug level monitoring: Z51.81

## 2017-02-20 HISTORY — DX: Long term (current) use of anticoagulants: Z79.01

## 2017-02-26 ENCOUNTER — Ambulatory Visit (INDEPENDENT_AMBULATORY_CARE_PROVIDER_SITE_OTHER): Payer: Medicare Other | Admitting: Pharmacist

## 2017-02-26 DIAGNOSIS — Z7901 Long term (current) use of anticoagulants: Secondary | ICD-10-CM

## 2017-02-26 DIAGNOSIS — Z5181 Encounter for therapeutic drug level monitoring: Secondary | ICD-10-CM

## 2017-02-26 LAB — POCT INR: INR: 4.6

## 2017-02-28 ENCOUNTER — Ambulatory Visit: Payer: Medicare Other | Admitting: Cardiology

## 2017-03-03 ENCOUNTER — Ambulatory Visit: Payer: Medicare Other | Admitting: Cardiology

## 2017-03-04 DIAGNOSIS — H269 Unspecified cataract: Secondary | ICD-10-CM | POA: Insufficient documentation

## 2017-03-04 DIAGNOSIS — K2100 Gastro-esophageal reflux disease with esophagitis, without bleeding: Secondary | ICD-10-CM

## 2017-03-04 DIAGNOSIS — I447 Left bundle-branch block, unspecified: Secondary | ICD-10-CM | POA: Insufficient documentation

## 2017-03-04 DIAGNOSIS — M1990 Unspecified osteoarthritis, unspecified site: Secondary | ICD-10-CM | POA: Insufficient documentation

## 2017-03-04 DIAGNOSIS — E039 Hypothyroidism, unspecified: Secondary | ICD-10-CM

## 2017-03-04 DIAGNOSIS — R64 Cachexia: Secondary | ICD-10-CM

## 2017-03-04 DIAGNOSIS — I10 Essential (primary) hypertension: Secondary | ICD-10-CM | POA: Insufficient documentation

## 2017-03-04 DIAGNOSIS — E119 Type 2 diabetes mellitus without complications: Secondary | ICD-10-CM | POA: Insufficient documentation

## 2017-03-04 DIAGNOSIS — E785 Hyperlipidemia, unspecified: Secondary | ICD-10-CM

## 2017-03-04 DIAGNOSIS — K219 Gastro-esophageal reflux disease without esophagitis: Secondary | ICD-10-CM | POA: Insufficient documentation

## 2017-03-04 DIAGNOSIS — J449 Chronic obstructive pulmonary disease, unspecified: Secondary | ICD-10-CM | POA: Insufficient documentation

## 2017-03-04 DIAGNOSIS — E876 Hypokalemia: Secondary | ICD-10-CM | POA: Insufficient documentation

## 2017-03-04 DIAGNOSIS — N189 Chronic kidney disease, unspecified: Secondary | ICD-10-CM | POA: Insufficient documentation

## 2017-03-04 DIAGNOSIS — E1142 Type 2 diabetes mellitus with diabetic polyneuropathy: Secondary | ICD-10-CM

## 2017-03-04 HISTORY — DX: Cachexia: R64

## 2017-03-04 HISTORY — DX: Left bundle-branch block, unspecified: I44.7

## 2017-03-04 HISTORY — DX: Type 2 diabetes mellitus with diabetic polyneuropathy: E11.42

## 2017-03-04 HISTORY — DX: Hypothyroidism, unspecified: E03.9

## 2017-03-04 HISTORY — DX: Hypokalemia: E87.6

## 2017-03-04 HISTORY — DX: Gastro-esophageal reflux disease with esophagitis, without bleeding: K21.00

## 2017-03-04 HISTORY — DX: Hyperlipidemia, unspecified: E78.5

## 2017-03-05 NOTE — Progress Notes (Signed)
Cardiology Office Note:    Date:  03/06/2017   ID:  Craig Dixon, DOB 04-Sep-1940, MRN [id number]  PCP:  Raelyn Number, MD  Cardiologist:  Shirlee More, MD    Referring MD: Raelyn Number, MD    ASSESSMENT:    1. Chronic systolic heart failure ([place])   2. H/O heart valve replacement with mechanical valve   3. Chronic anticoagulation   4. Coronary artery disease involving native coronary artery of native heart with angina pectoris ([place])   5. CKD (chronic kidney disease), stage III   6. 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 ICD (implantable cardioverter-defibrillator) in place   8. Hyperlipidemia, unspecified hyperlipidemia type    PLAN:    In order of problems listed above:  1. Stable compensated remarkably well with his daughters close supervision and titration of medications. He'll continue his current diuretic and minimal dose of beta blocker and ACE inhibitor for class effect. Hypotension precludes up titration to guideline directed dosage. 2. Stable continue warfarin she is concerned that there may be inaccuracy of home pro times will draw one in the office today and she'll compare with one at her house today. Goal INR is 2.5-3.5. 3. Stable continue warfarin with mechanical AVR 4. Stable continue current medical treatment anticoagulation beta blocker oral nitrates and high intensity statin. 5. Stable recheck renal function with his clinical deterioration 6. Stable continue current treatment beta blocker ACE inhibitor and diuretic 7. Stable function he's had no VT VF therapy his last device check was reviewed 8. Stable continue his statin   Next appointment: 3 months   Medication Adjustments/Labs and Tests Ordered: Current medicines are reviewed at length with the patient today.  Concerns regarding medicines are outlined above.  No orders of the defined types were placed in this encounter.  No orders of the defined  types were placed in this encounter.   Chief Complaint  Patient presents with  . Follow-up    3 month flup appt, has an AVR  . Congestive Heart Failure  . Coronary Artery Disease    History of Present Illness:    Craig Dixon is a 76 y.o. male with a hx of CAD,systolic CHF,chronic Atrial Fibrillation, Dyslipidemia, Peripheral Vascular Disease with CLI, S/P CABG, Valvular heart disease with a St Jude AVR and an ICD last seen 3 months ago.He is increasingly weak and fatigued since his narcotics were changed.Fortunately his heart failure is stable at home no edema shortness of breath he's had no chest pain palpitation or ICD discharge and presently is not having pain from his chronic limb ischemia. He was seen by his PCP yesterday and x-rays were ordered for back pain. He is also severely bothered by constipation Compliance with diet, lifestyle and medications: Yes Past Medical History:  Diagnosis Date  . Arthritis   . Atherosclerosis of native arteries of extremity with rest pain ([place]) 08/17/2015  . Cachexia ([place]) 03/04/2017  . Carotid stenosis 01/18/2015   Overview:  Severe asymptomatic L ICA stenosis  . Cataract   . Chronic anticoagulation 02/20/2017  . Chronic kidney disease   . Chronic systolic heart failure ([place]) 01/18/2015  . CKD (chronic kidney disease), stage III 01/18/2015  . COPD (chronic obstructive pulmonary disease) ([place])   . Coronary artery disease involving native coronary artery of native heart with angina pectoris ([place]) 01/18/2015  . Diabetes mellitus without complication ([place])   . Diabetic polyneuropathy associated with type 2  diabetes mellitus ([place]) 03/04/2017  . Encounter for therapeutic drug monitoring 02/20/2017  . GERD (gastroesophageal reflux disease)   . H/O heart valve replacement with mechanical valve 01/18/2015   Overview:  St Jude AVR  . History of leg amputation ([place]) 02/18/2016  . Hyperlipidemia 03/04/2017  . Hypertension   . Hypertensive heart and kidney disease with  heart failure and chronic kidney disease ([place]) 01/18/2015  . Hypokalemia 03/04/2017  . Hypothyroidism (acquired) 03/04/2017  . ICD (implantable cardioverter-defibrillator) in place 01/25/2016  . LBBB (left bundle branch block) 03/04/2017  . Onychomycosis due to dermatophyte 02/18/2016  . PAD (peripheral artery disease) ([place]) 07/12/2015   Overview:  With R AKA and LLE CLI,: Based on his angiographic findings, this patient needs: palliative mgmt vs L AKA. I discussed in depth with the patient the nature of atherosclerosis, and emphasized the importance of maximal medical management including strict control of blood pressure, blood glucose, and lipid levels, obtaining regular exercise, and cessation of smoking. The patient is aware that without maximal medical management the underlying atherosclerotic disease process will progress, limiting the benefit of any interventions.  . Paroxysmal atrial fibrillation ([place]) 12/17/2016  . Peripheral arterial occlusive disease ([place]) 02/18/2016  . Reflux esophagitis 03/04/2017  . Thyroid disease     Past Surgical History:  Procedure Laterality Date  . BELOW KNEE LEG AMPUTATION Right 2001  . CARDIAC VALVE REPLACEMENT    . CORONARY ARTERY BYPASS GRAFT    . EP IMPLANTABLE DEVICE    . leg ampuation  2001   right leg  . pace maker  Aug. 2014  . PACEMAKER INSERTION    . PERIPHERAL VASCULAR CATHETERIZATION N/A 08/17/2015   Procedure: Abdominal Aortogram w/Lower Extremity;  Surgeon: Conrad [place] Ben[place], MD;  Location: [facility];  Service: Cardiovascular;  Laterality: N/A;    Current Medications: Current Meds  Medication Sig  . AMITIZA 24 MCG capsule Take 24 mcg by mouth daily.  Marland Kitchen amitriptyline (ELAVIL) 50 MG tablet 50 mg every evening  . atorvastatin (LIPITOR) 40 MG tablet Take 40 mg by mouth daily at 6 PM.   . COUMADIN 1 MG tablet Take 1.5 mg by mouth 4 (four) times a week. 3 mg Mon, Wed, and Fri.  1.5 mg on Tues, Thurs, Sat, Sun  . COUMADIN 3 MG tablet Take 3 mg by  mouth daily. 3 mg Mon, Wed, and Fri.  1.5 mg on Tues, Thurs, Sat, Sun  . furosemide (LASIX) 40 MG tablet Take 40 mg by mouth daily. Reported on 08/09/2015  . isosorbide mononitrate (IMDUR) 60 MG 24 hr tablet Take 60 mg by mouth daily. Reported on 08/09/2015  . levothyroxine (SYNTHROID, LEVOTHROID) 112 MCG tablet Take 112 mcg by mouth daily before breakfast.   . lisinopril (PRINIVIL,ZESTRIL) 5 MG tablet Take 2.5 mg by mouth every Monday, Wednesday, and Friday.   . metoprolol succinate (TOPROL-XL) 25 MG 24 hr tablet Take 25 mg by mouth 4 (four) times a week. 25 mg on Tues, Thurs, Sat, and Sun  . mupirocin ointment (BACTROBAN) 2 % Apply 1 application topically daily as needed (for wound care).   . nitrofurantoin, macrocrystal-monohydrate, (MACROBID) 100 MG capsule TAKE 1 CAPSULE BY MOUTH EVERY TWELVE (12) HOURS WITH food FOR 5 DAYS  . OVER THE COUNTER MEDICATION Apply 1 application topically daily.  Marland Kitchen oxyCODONE-acetaminophen (PERCOCET) 10-325 MG tablet Take 1 tablet by mouth every 8 (eight) hours as needed for pain.  . polyethylene glycol powder (GLYCOLAX/MIRALAX) powder Take 17 g by mouth at  bedtime as needed for mild constipation.   . Wound Dressings (SOLOSITE WOUND GEL) GEL Apply 1 application topically daily as needed (for would care).     Allergies:   Clonidine; Gabapentin; Hydralazine; Nsaids; Talwin [pentazocine]; Tape; and Tolmetin   Social History   Social History  . Marital status: Single    Spouse name: N/A  . Number of children: N/A  . Years of education: N/A   Social History Main Topics  . Smoking status: Current Every Day Smoker    Packs/day: 1.00  . Smokeless tobacco: Never Used  . Alcohol use No  . Drug use: No  . Sexual activity: Not Asked   Other Topics Concern  . None   Social History Narrative  . None     Family History: The patient's family history includes Heart attack in his father; Heart disease in his father, sister, and son. ROS:   Please see the history  of present illness.    All other systems reviewed and are negative.  EKGs/Labs/Other Studies Reviewed:    The following studies were reviewed today:  EKG:  EKG ordered today.  The ekg ordered today demonstrates sinus bradycardia 50 BPM, QTc is normal 12/05/16 :St. Jude Medical dual chamber defibrillator Following MD: Dr. Marcene Corning: Raelyn Number, MD Referring Provider: Bosie Helper Assessment:  NORMAL DEVICE FUNCTION NO SIGNIFICANT ARRHYTHMIAS NO EVIDENCE OF FLUID RETENTION, based on current Thoracic Impedance Measures  Recently elevated but improved.  1. Chronic systolic heart failure (CMS-HCC)  2. ICD (implantable cardioverter-defibrillator) ST JUDE/ ABBOTT  3. Paroxysmal atrial fibrillation (CMS-HCC)    Recent Labs: 01/21/17: Cr 1.59 K 4.5, CBC normal INR 4.6 02/26/17 managed in coumadin clinic No results found for requested labs within last 8760 hours.  Recent Lipid Panel 11/20/16:  Chol 130, HDL 30, LDL 74 No results found for: CHOL, TRIG, HDL, CHOLHDL, VLDL, LDLCALC, LDLDIRECT  Physical Exam:    VS:  BP 122/78 (BP Location: Right Arm, Patient Position: Sitting)   Pulse 92   Ht 5\' 8"  (1.727 m)   Wt 120 lb (54.4 kg) Comment: per pts daughter  SpO2 98%   BMI 18.25 kg/m     Wt Readings from Last 3 Encounters:  03/06/17 120 lb (54.4 kg)  09/15/15 125 lb (56.7 kg)  08/17/15 125 lb (56.7 kg)     GEN:  Well nourished, well developed in no acute distress HEENT: Normal NECK: No JVD; No carotid bruits LYMPHATICS: No lymphadenopathy CARDIAC: RRR, no murmurs, rubs, gallops RESPIRATORY:  Clear to auscultation without rales, wheezing or rhonchi  ABDOMEN: Soft, non-tender, non-distended MUSCULOSKELETAL:  No edema; No deformity  SKIN: Warm and dry NEUROLOGIC:  Alert and oriented x 3 PSYCHIATRIC:  Normal affect    Signed, Shirlee More, MD  03/06/2017 11:27 AM    [facility]

## 2017-03-06 ENCOUNTER — Encounter: Payer: Self-pay | Admitting: Cardiology

## 2017-03-06 ENCOUNTER — Ambulatory Visit (INDEPENDENT_AMBULATORY_CARE_PROVIDER_SITE_OTHER): Payer: Medicare Other | Admitting: Cardiology

## 2017-03-06 ENCOUNTER — Ambulatory Visit (HOSPITAL_BASED_OUTPATIENT_CLINIC_OR_DEPARTMENT_OTHER)
Admission: RE | Admit: 2017-03-06 | Discharge: 2017-03-06 | Disposition: A | Discharge status: Home / Self Care | Payer: Medicare Other | Source: Ambulatory Visit | Attending: Family | Admitting: Family

## 2017-03-06 ENCOUNTER — Other Ambulatory Visit (HOSPITAL_BASED_OUTPATIENT_CLINIC_OR_DEPARTMENT_OTHER): Payer: Self-pay | Admitting: Family

## 2017-03-06 VITALS — BP 122/78 | HR 92 | Ht 68.0 in | Wt 120.0 lb

## 2017-03-06 DIAGNOSIS — Z7901 Long term (current) use of anticoagulants: Secondary | ICD-10-CM

## 2017-03-06 DIAGNOSIS — I25119 Atherosclerotic heart disease of native coronary artery with unspecified angina pectoris: Secondary | ICD-10-CM

## 2017-03-06 DIAGNOSIS — N183 Chronic kidney disease, stage 3 unspecified: Secondary | ICD-10-CM

## 2017-03-06 DIAGNOSIS — I998 Other disorder of circulatory system: Secondary | ICD-10-CM | POA: Insufficient documentation

## 2017-03-06 DIAGNOSIS — Z952 Presence of prosthetic heart valve: Secondary | ICD-10-CM

## 2017-03-06 DIAGNOSIS — M5440 Lumbago with sciatica, unspecified side: Secondary | ICD-10-CM | POA: Insufficient documentation

## 2017-03-06 DIAGNOSIS — M47896 Other spondylosis, lumbar region: Secondary | ICD-10-CM | POA: Diagnosis not present

## 2017-03-06 DIAGNOSIS — Z95810 Presence of automatic (implantable) cardiac defibrillator: Secondary | ICD-10-CM | POA: Diagnosis not present

## 2017-03-06 DIAGNOSIS — I70229 Atherosclerosis of native arteries of extremities with rest pain, unspecified extremity: Secondary | ICD-10-CM | POA: Insufficient documentation

## 2017-03-06 DIAGNOSIS — I130 Hypertensive heart and chronic kidney disease with heart failure and stage 1 through stage 4 chronic kidney disease, or unspecified chronic kidney disease: Secondary | ICD-10-CM

## 2017-03-06 DIAGNOSIS — I5022 Chronic systolic (congestive) heart failure: Secondary | ICD-10-CM | POA: Diagnosis not present

## 2017-03-06 DIAGNOSIS — E785 Hyperlipidemia, unspecified: Secondary | ICD-10-CM

## 2017-03-06 NOTE — Patient Instructions (Addendum)
Medication Instructions:  Your physician recommends that you continue on your current medications as directed. Please refer to the Current Medication list given to you today.   Labwork: Your physician recommends that you return for lab work in: today.   Testing/Procedures: None  Follow-Up: Your physician recommends that you schedule a follow-up appointment in: 3 months.   Any Other Special Instructions Will Be Listed Below (If Applicable).     If you need a refill on your cardiac medications before your next appointment, please call your pharmacy.    KNOW YOUR HEART FAILURE ZONES  Green Zone (Your Goal):  Marland Kitchen No shortness of breath or trouble bleeding . No weight gain of more than 3 pounds in one day or 5 pounds in a week . No swelling in your ankles, feet, stomach, or hands . No chest discomfort, heaviness or pain  Yellow Zone (Call the Doctor to get help!): Having 1 or more of the following: . Weight gain of 3 pounds in 1 day or 5 pounds in 1 week . More swelling of your feet, ankles, stomach, or hands . It is harder to breath when lying down. You need to sit up . Chest discomfort, heaviness, or pain . You feel more tired or have less energy than normal . New or worsening dizziness . Dry hacking cough . You feel uneasy and you know something is not right  Red Zone (Call 911-EMERGENCY): . Struggling to breathe. This does not go away when you sit up. . Stronger and more regular amounts of chest discomfort . New confusion or can't think clearly . Fainting or near fainting   Resources form the American heart Association: RetroDivas.ch.jsp

## 2017-03-07 ENCOUNTER — Encounter: Payer: Self-pay | Admitting: Pharmacist

## 2017-03-07 ENCOUNTER — Ambulatory Visit (INDEPENDENT_AMBULATORY_CARE_PROVIDER_SITE_OTHER): Payer: Medicare Other | Admitting: Interventional Cardiology

## 2017-03-07 DIAGNOSIS — Z7901 Long term (current) use of anticoagulants: Secondary | ICD-10-CM

## 2017-03-07 DIAGNOSIS — Z5181 Encounter for therapeutic drug level monitoring: Secondary | ICD-10-CM

## 2017-03-07 LAB — COMPREHENSIVE METABOLIC PANEL
A/G RATIO: 1.2 (ref 1.2–2.2)
ALT: 10 IU/L (ref 0–44)
AST: 19 IU/L (ref 0–40)
Albumin: 4.4 g/dL (ref 3.5–4.8)
Alkaline Phosphatase: 133 IU/L — ABNORMAL HIGH (ref 39–117)
BILIRUBIN TOTAL: 0.5 mg/dL (ref 0.0–1.2)
BUN/Creatinine Ratio: 19 (ref 10–24)
BUN: 37 mg/dL — ABNORMAL HIGH (ref 8–27)
CALCIUM: 9.8 mg/dL (ref 8.6–10.2)
CHLORIDE: 104 mmol/L (ref 96–106)
CO2: 20 mmol/L (ref 20–29)
Creatinine, Ser: 1.97 mg/dL — ABNORMAL HIGH (ref 0.76–1.27)
GFR, EST AFRICAN AMERICAN: 37 mL/min/{1.73_m2} — AB (ref >59)
GFR, EST NON AFRICAN AMERICAN: 32 mL/min/{1.73_m2} — AB (ref >59)
Globulin, Total: 3.7 g/dL (ref 1.5–4.5)
Glucose: 154 mg/dL — ABNORMAL HIGH (ref 65–99)
POTASSIUM: 4.7 mmol/L (ref 3.5–5.2)
Sodium: 140 mmol/L (ref 134–144)
TOTAL PROTEIN: 8.1 g/dL (ref 6.0–8.5)

## 2017-03-07 LAB — PROTIME-INR
INR: 5.8 — ABNORMAL HIGH (ref 0.8–1.2)
PROTHROMBIN TIME: 54.9 s — AB (ref 9.1–12.0)

## 2017-03-07 LAB — BRAIN NATRIURETIC PEPTIDE: BNP: 2037.3 pg/mL — ABNORMAL HIGH (ref 0.0–100.0)

## 2017-03-11 ENCOUNTER — Ambulatory Visit (INDEPENDENT_AMBULATORY_CARE_PROVIDER_SITE_OTHER): Payer: Medicare Other | Admitting: Cardiology

## 2017-03-11 DIAGNOSIS — Z7901 Long term (current) use of anticoagulants: Secondary | ICD-10-CM

## 2017-03-11 DIAGNOSIS — Z5181 Encounter for therapeutic drug level monitoring: Secondary | ICD-10-CM

## 2017-03-11 LAB — PROTIME-INR: INR: 5.5 — AB (ref 0.9–1.1)

## 2017-03-18 ENCOUNTER — Encounter: Payer: Self-pay | Admitting: Cardiology

## 2017-03-18 ENCOUNTER — Other Ambulatory Visit: Payer: Self-pay

## 2017-03-18 ENCOUNTER — Ambulatory Visit (INDEPENDENT_AMBULATORY_CARE_PROVIDER_SITE_OTHER): Payer: Medicare Other | Admitting: Cardiology

## 2017-03-18 DIAGNOSIS — Z952 Presence of prosthetic heart valve: Secondary | ICD-10-CM | POA: Diagnosis not present

## 2017-03-18 DIAGNOSIS — Z7901 Long term (current) use of anticoagulants: Secondary | ICD-10-CM

## 2017-03-18 DIAGNOSIS — I480 Paroxysmal atrial fibrillation: Secondary | ICD-10-CM

## 2017-03-18 DIAGNOSIS — Z5181 Encounter for therapeutic drug level monitoring: Secondary | ICD-10-CM | POA: Diagnosis not present

## 2017-03-18 LAB — POCT INR: INR: 4.5

## 2017-03-18 MED ORDER — LISINOPRIL 5 MG PO TABS: 2.5000 mg | ORAL_TABLET | ORAL | 11 refills | Status: DC

## 2017-03-18 NOTE — Progress Notes (Signed)
This encounter was created in error - please disregard.

## 2017-03-25 ENCOUNTER — Ambulatory Visit (INDEPENDENT_AMBULATORY_CARE_PROVIDER_SITE_OTHER): Payer: Medicare Other

## 2017-03-25 DIAGNOSIS — Z5181 Encounter for therapeutic drug level monitoring: Secondary | ICD-10-CM | POA: Diagnosis not present

## 2017-03-25 DIAGNOSIS — Z7901 Long term (current) use of anticoagulants: Secondary | ICD-10-CM

## 2017-03-25 LAB — POCT INR: INR: 2.3

## 2017-04-01 ENCOUNTER — Ambulatory Visit (INDEPENDENT_AMBULATORY_CARE_PROVIDER_SITE_OTHER): Payer: Medicare Other

## 2017-04-01 DIAGNOSIS — Z7901 Long term (current) use of anticoagulants: Secondary | ICD-10-CM

## 2017-04-01 DIAGNOSIS — Z5181 Encounter for therapeutic drug level monitoring: Secondary | ICD-10-CM | POA: Diagnosis not present

## 2017-04-01 DIAGNOSIS — Z952 Presence of prosthetic heart valve: Secondary | ICD-10-CM | POA: Diagnosis not present

## 2017-04-01 LAB — POCT INR: INR: 2.5

## 2017-04-04 ENCOUNTER — Ambulatory Visit (INDEPENDENT_AMBULATORY_CARE_PROVIDER_SITE_OTHER): Payer: Medicare Other | Admitting: Cardiology

## 2017-04-04 ENCOUNTER — Encounter: Payer: Self-pay | Admitting: Cardiology

## 2017-04-04 VITALS — BP 112/68 | HR 73 | Ht 68.0 in | Wt 125.0 lb

## 2017-04-04 DIAGNOSIS — Z95810 Presence of automatic (implantable) cardiac defibrillator: Secondary | ICD-10-CM

## 2017-04-04 DIAGNOSIS — Z01810 Encounter for preprocedural cardiovascular examination: Secondary | ICD-10-CM | POA: Diagnosis not present

## 2017-04-04 DIAGNOSIS — I480 Paroxysmal atrial fibrillation: Secondary | ICD-10-CM | POA: Diagnosis not present

## 2017-04-04 DIAGNOSIS — Z7901 Long term (current) use of anticoagulants: Secondary | ICD-10-CM

## 2017-04-04 DIAGNOSIS — C679 Malignant neoplasm of bladder, unspecified: Secondary | ICD-10-CM

## 2017-04-04 DIAGNOSIS — I25119 Atherosclerotic heart disease of native coronary artery with unspecified angina pectoris: Secondary | ICD-10-CM

## 2017-04-04 DIAGNOSIS — I5022 Chronic systolic (congestive) heart failure: Secondary | ICD-10-CM

## 2017-04-04 DIAGNOSIS — Z952 Presence of prosthetic heart valve: Secondary | ICD-10-CM

## 2017-04-04 NOTE — Patient Instructions (Signed)
Medication Instructions:  Your physician recommends that you continue on your current medications as directed. Please refer to the Current Medication list given to you today.  Labwork: None  Testing/Procedures: None  Follow-Up: Your physician recommends that you schedule a follow-up appointment in: keep appointment already scheduled.   Any Other Special Instructions Will Be Listed Below (If Applicable).     If you need a refill on your cardiac medications before your next appointment, please call your pharmacy.

## 2017-04-04 NOTE — Progress Notes (Signed)
Cardiology Office Note:    Date:  04/04/2017   ID:  Craig Dixon, DOB 03/10/1941, MRN [id number]  PCP:  Raelyn Number, MD  Cardiologist:  Shirlee More, MD    Referring MD: Raelyn Number, MD    ASSESSMENT:    1. Preoperative cardiovascular examination   2. Malignant neoplasm of urinary bladder, unspecified site ([place])   3. H/O heart valve replacement with mechanical valve   4. Chronic anticoagulation   5. ICD (implantable cardioverter-defibrillator) in place   6. Chronic systolic heart failure (HCC)   7. Paroxysmal atrial fibrillation (HCC)    PLAN:    In order of problems listed above:  1. He is a very high risk patient for any procedure with anesthesia because of his underlying coronary disease mechanical heart valve atrial fibrillation and anticoagulation. He is also frail near cachectic has significant COPD CK D and chronic limb ischemia. He'll require bridging anticoagulation with Lovenox preoperatively while withdrawing Coumadin and reinstituting Coumadin the night of surgery and Lovenox within 24-48 hours after surgery.he should have a procedure with the least riskof postoperative bleeding complications.He would be best suited for general anesthesia to avoid hypotension and the need for fluid loading and should be in a monitored bed postoperatively for 24 hours. despite all our endeavors he remains high risk for even minor procedures electively 2. See above he is advised to have TURB 3. Stable continue warfarin goal INR 2.5 4. Continue current anticoagulant, he will need bridging with Lovenox if surgery is performed 5. Stable intraoperatively he would need a Smart Magnet 6. Stable tenuous leak compensated continue current diuretic and cardiac medications 7. Stable continue anticoagulant and beta blocker 8. Stable after CABG I would not advise an ischemia evaluation preoperatively   Next appointment:  2 months   Medication Adjustments/Labs and Tests Ordered: Current  medicines are reviewed at length with the patient today.  Concerns regarding medicines are outlined above.  No orders of the defined types were placed in this encounter.  No orders of the defined types were placed in this encounter.   Chief Complaint  Patient presents with  . Follow-up    flup to discuss TURP procedure  . Anticoagulation    History of Present Illness:    Craig Dixon is a 76 y.o. male with a hx of CAD,systolic CHF,chronic Atrial Fibrillation, Dyslipidemia, Peripheral Vascular Disease with CLI, S/P CABG, Valvular heart disease with a St Jude AVR and an ICD and now with gross hematuria and bladder cancer advised TURB last seen one month ago.He is having gross hematuria and cysto shows a tumor. Overall he is doing better than last visit after treatment of urinary tract  Infection. He is tenuously compensated and at high risk off anticoagulants with atrial fibrillation and a St. Jude mechanical valve. Because of anticoagulation spinal anesthesia support choice and he is best suited for general anesthesia. Because of the necessity of resuming anticoagulation at most 24-48 hours after surgery he should have the least invasive procedure performed possible to allow his anticoagulants to be reintroduced.the patient is quite hesitant about surgery initially he tells me he will not have it done and then after the family had a discussion they are seeking a second opinion prior to scheduling. He would require bridging anticoagulation with Lovenox after discontinuing Coumadin 5 days before  Coumadin the night of surgery and restarting Lovenox 24-48 hours after his procedure.he has an ICD and will need a Smart magnet in the operating room. With  all of his comorbidities I feel he should be placed in a telemetry bed after surgery 24 hours of observation. Compliance with diet, lifestyle and medications: yes, he is meticulously managed by his family Past Medical History:  Diagnosis Date  .  Arthritis   . Atherosclerosis of native arteries of extremity with rest pain ([place]) 08/17/2015  . Cachexia ([place]) 03/04/2017  . Carotid stenosis 01/18/2015   Overview:  Severe asymptomatic L ICA stenosis  . Cataract   . Chronic anticoagulation 02/20/2017  . Chronic kidney disease   . Chronic systolic heart failure ([place]) 01/18/2015  . CKD (chronic kidney disease), stage III 01/18/2015  . COPD (chronic obstructive pulmonary disease) ([place])   . Coronary artery disease involving native coronary artery of native heart with angina pectoris ([place]) 01/18/2015  . Diabetes mellitus without complication ([place])   . Diabetic polyneuropathy associated with type 2 diabetes mellitus ([place]) 03/04/2017  . Encounter for therapeutic drug monitoring 02/20/2017  . GERD (gastroesophageal reflux disease)   . H/O heart valve replacement with mechanical valve 01/18/2015   Overview:  St Jude AVR  . History of leg amputation ([place]) 02/18/2016  . Hyperlipidemia 03/04/2017  . Hypertension   . 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 Hypokalemia 03/04/2017  . Hypothyroidism (acquired) 03/04/2017  . ICD (implantable cardioverter-defibrillator) in place 01/25/2016  . LBBB (left bundle branch block) 03/04/2017  . Onychomycosis due to dermatophyte 02/18/2016  . PAD (peripheral artery disease) ([place]) 07/12/2015   Overview:  With R AKA and LLE CLI,: Based on his angiographic findings, this patient needs: palliative mgmt vs L AKA. I discussed in depth with the patient the nature of atherosclerosis, and emphasized the importance of maximal medical management including strict control of blood pressure, blood glucose, and lipid levels, obtaining regular exercise, and cessation of smoking. The patient is aware that without maximal medical management the underlying atherosclerotic disease process will progress, limiting the benefit of any interventions.  . Paroxysmal atrial fibrillation ([place]) 12/17/2016    . Peripheral arterial occlusive disease ([place]) 02/18/2016  . Reflux esophagitis 03/04/2017  . Thyroid disease     Past Surgical History:  Procedure Laterality Date  . BELOW KNEE LEG AMPUTATION Right 2001  . CARDIAC VALVE REPLACEMENT    . CORONARY ARTERY BYPASS GRAFT    . EP IMPLANTABLE DEVICE    . leg ampuation  2001   right leg  . pace maker  Aug. 2014  . PACEMAKER INSERTION    . PERIPHERAL VASCULAR CATHETERIZATION N/A 08/17/2015   Procedure: Abdominal Aortogram w/Lower Extremity;  Surgeon: Conrad Azalea Park, MD;  Location: [facility];  Service: Cardiovascular;  Laterality: N/A;    Current Medications: Current Meds  Medication Sig  . amitriptyline (ELAVIL) 50 MG tablet 50 mg every evening  . atorvastatin (LIPITOR) 40 MG tablet Take 40 mg by mouth daily at 6 PM.   . COUMADIN 1 MG tablet Take 1.5 mg by mouth as directed. Currently taking 1.5mg  everyday except 1 mg on Sat  . docusate sodium (COLACE) 100 MG capsule Take 100 mg by mouth 2 (two) times daily.  . furosemide (LASIX) 40 MG tablet Take 40 mg by mouth daily. Reported on 08/09/2015  . isosorbide mononitrate (IMDUR) 60 MG 24 hr tablet Take 60 mg by mouth daily. Reported on 08/09/2015  . levothyroxine (SYNTHROID, LEVOTHROID) 112 MCG tablet Take 112 mcg by mouth daily before breakfast.   . lisinopril (PRINIVIL,ZESTRIL) 5 MG tablet Take  0.5 tablets (2.5 mg total) by mouth every Monday, Wednesday, and Friday.  . metoprolol succinate (TOPROL-XL) 25 MG 24 hr tablet Take 25 mg by mouth 4 (four) times a week. 25 mg on Tues, Thurs, Sat, and Sun  . mupirocin ointment (BACTROBAN) 2 % Apply 1 application topically daily as needed (for wound care).   . nitroGLYCERIN (NITROSTAT) 0.4 MG SL tablet DISSOLVE 1 TABLET UNDER THE TONGUE EVERY 5 MINUTES AS NEEDED FOR CHEST PAIN. (MAXIMUM OF 3 DOSES)  . OVER THE COUNTER MEDICATION Apply 1 application topically daily.  Marland Kitchen oxyCODONE-acetaminophen (PERCOCET) 10-325 MG tablet Take 1 tablet by mouth every 8  (eight) hours as needed for pain.  . polyethylene glycol powder (GLYCOLAX/MIRALAX) powder Take 17 g by mouth at bedtime as needed for mild constipation.   . Wound Dressings (SOLOSITE WOUND GEL) GEL Apply 1 application topically daily as needed (for would care).     Allergies:   Clonidine; Gabapentin; Hydralazine; Nsaids; Talwin [pentazocine]; Tape; and Tolmetin   Social History   Social History  . Marital status: Single    Spouse name: N/A  . Number of children: N/A  . Years of education: N/A   Social History Main Topics  . Smoking status: Current Every Day Smoker    Packs/day: 1.00  . Smokeless tobacco: Never Used  . Alcohol use No  . Drug use: No  . Sexual activity: Not Asked   Other Topics Concern  . None   Social History Narrative  . None     Family History: The patient's family history includes Heart attack in his father; Heart disease in his father, sister, and son. ROS:   Please see the history of present illness.    All other systems reviewed and are negative.  EKGs/Labs/Other Studies Reviewed:    The following studies were reviewed today:  Recent Labs: 03/06/2017: ALT 10; BNP 2,037.3; BUN 37; Creatinine, Ser 1.97; Potassium 4.7; Sodium 140  Recent Lipid Panel No results found for: CHOL, TRIG, HDL, CHOLHDL, VLDL, LDLCALC, LDLDIRECT  Physical Exam:    VS:  BP 112/68 (BP Location: Right Arm, Patient Position: Sitting)   Pulse 73   Ht 5\' 8"  (1.727 m)   Wt 125 lb (56.7 kg)   SpO2 96%   BMI 19.01 kg/m     Wt Readings from Last 3 Encounters:  04/04/17 125 lb (56.7 kg)  03/06/17 120 lb (54.4 kg)  09/15/15 125 lb (56.7 kg)     GEN: he is very frail and chronically ill and near cachexia. HEENT: Normal NECK: No JVD; No carotid bruits LYMPHATICS: No lymphadenopathy CARDIAC: regular rhythm sharp closing sound of St.Jude AVRNO S3 no aortic regurgitation RESPIRATORY:  Markedly diminished breath sounds no rales or wheezing ABDOMEN: Soft, non-tender,  non-distended MUSCULOSKELETAL:  No edema; No deformity  SKIN: Warm and dry NEUROLOGIC:  Alert and oriented x 3 PSYCHIATRIC:  Normal affect    Signed, Shirlee More, MD  04/04/2017 11:54 AM    [facility]

## 2017-04-08 LAB — POCT INR: INR: 2.3

## 2017-04-09 ENCOUNTER — Ambulatory Visit (INDEPENDENT_AMBULATORY_CARE_PROVIDER_SITE_OTHER): Payer: Medicare Other | Admitting: Internal Medicine

## 2017-04-09 DIAGNOSIS — Z7901 Long term (current) use of anticoagulants: Secondary | ICD-10-CM | POA: Diagnosis not present

## 2017-04-09 DIAGNOSIS — Z952 Presence of prosthetic heart valve: Secondary | ICD-10-CM | POA: Diagnosis not present

## 2017-04-09 DIAGNOSIS — Z01810 Encounter for preprocedural cardiovascular examination: Secondary | ICD-10-CM

## 2017-04-15 ENCOUNTER — Other Ambulatory Visit: Payer: Self-pay

## 2017-04-15 ENCOUNTER — Telehealth: Payer: Self-pay | Admitting: Cardiology

## 2017-04-15 ENCOUNTER — Telehealth: Payer: Self-pay

## 2017-04-15 DIAGNOSIS — R310 Gross hematuria: Secondary | ICD-10-CM | POA: Insufficient documentation

## 2017-04-15 LAB — POCT INR: INR: 2.8

## 2017-04-15 MED ORDER — LISINOPRIL 5 MG PO TABS: 2.5000 mg | ORAL_TABLET | ORAL | 11 refills | Status: DC

## 2017-04-15 NOTE — Telephone Encounter (Signed)
-----   Message from Richardo Priest, MD sent at 04/15/2017  3:17 PM EDT ----- No they are in good hands ----- Message ----- From: Stevan Born, [place]: 04/15/2017   2:24 PM To: Richardo Priest, MD  Per pts daughter, he did see Dr Venia Minks today, and he does advise that he will have surgery in the next 3 weeks.   His daughter wants to know if he will need to be seen by you again for preop clearance.  Please advise

## 2017-04-15 NOTE — Telephone Encounter (Signed)
error 

## 2017-04-15 NOTE — Telephone Encounter (Signed)
Pts daughter advised that no preop appt is needed to see Dr Bettina Gavia prior to surgery.  Pts daughter verbalized understanding.

## 2017-04-18 ENCOUNTER — Ambulatory Visit (INDEPENDENT_AMBULATORY_CARE_PROVIDER_SITE_OTHER): Payer: Medicare Other

## 2017-04-18 DIAGNOSIS — Z7901 Long term (current) use of anticoagulants: Secondary | ICD-10-CM | POA: Diagnosis not present

## 2017-04-18 DIAGNOSIS — Z01810 Encounter for preprocedural cardiovascular examination: Secondary | ICD-10-CM

## 2017-04-18 DIAGNOSIS — Z952 Presence of prosthetic heart valve: Secondary | ICD-10-CM | POA: Diagnosis not present

## 2017-04-21 ENCOUNTER — Telehealth: Payer: Self-pay | Admitting: Cardiology

## 2017-04-21 NOTE — Telephone Encounter (Signed)
Returned a call to the pt's dtr Jaceline & she stated that pt will have surgery on 05/08/17 by Dr. Venia Minks.  Pt requires a Lovenox Bridge per last Anticoagulation note. Scheduled appt with dtr to come into the office to have INR done on 04/29/17 at 1050am.

## 2017-04-21 NOTE — Telephone Encounter (Signed)
F/u Message  Pt daughter call requesting to speak with Doroteo Bradford in coumadin to f/u on pt coming into the office to get his coumadin checked. Please call back to discuss

## 2017-04-22 ENCOUNTER — Ambulatory Visit (INDEPENDENT_AMBULATORY_CARE_PROVIDER_SITE_OTHER): Payer: Medicare Other | Admitting: Interventional Cardiology

## 2017-04-22 DIAGNOSIS — Z952 Presence of prosthetic heart valve: Secondary | ICD-10-CM

## 2017-04-22 DIAGNOSIS — Z7901 Long term (current) use of anticoagulants: Secondary | ICD-10-CM

## 2017-04-22 DIAGNOSIS — Z01810 Encounter for preprocedural cardiovascular examination: Secondary | ICD-10-CM

## 2017-04-22 DIAGNOSIS — Z5181 Encounter for therapeutic drug level monitoring: Secondary | ICD-10-CM | POA: Diagnosis not present

## 2017-04-22 LAB — POCT INR: INR: 3.4

## 2017-04-29 ENCOUNTER — Ambulatory Visit (INDEPENDENT_AMBULATORY_CARE_PROVIDER_SITE_OTHER): Payer: Medicare Other

## 2017-04-29 DIAGNOSIS — Z7901 Long term (current) use of anticoagulants: Secondary | ICD-10-CM | POA: Diagnosis not present

## 2017-04-29 DIAGNOSIS — Z01810 Encounter for preprocedural cardiovascular examination: Secondary | ICD-10-CM | POA: Diagnosis not present

## 2017-04-29 DIAGNOSIS — Z952 Presence of prosthetic heart valve: Secondary | ICD-10-CM | POA: Diagnosis not present

## 2017-04-29 LAB — POCT INR: INR: 3.1

## 2017-04-29 MED ORDER — LOVENOX 60 MG/0.6ML ~~LOC~~ SOLN: 60.0000 mg | SUBCUTANEOUS | 1 refills | Status: DC

## 2017-04-29 NOTE — Patient Instructions (Addendum)
05/02/17: Last dose of Coumadin.  05/03/17: No Coumadin or Lovenox.  05/04/17: Inject Lovenox 60mg  in the fatty abdominal tissue at least 2 inches from the belly button once a day about 24 hours apart, 8pm rotate sites. No Coumadin.  05/05/17: Inject Lovenox in the fatty tissue every 24 hours, 8pm. No Coumadin.  05/06/17: Inject Lovenox in the fatty tissue every 24 hours, 8pm. No Coumadin.  05/07/17: No Lovenox. No Coumadin.  05/08/17: Procedure Day - No Lovenox - Resume Coumadin in the evening or as directed by doctor (take an extra half tablet with usual dose for 2 days then resume normal dose).  05/09/17: Resume Lovenox inject in the fatty tissue every 24 hours, 8am and take Coumadin.  05/10/17: Inject Lovenox in the fatty tissue every 24 hours, 8am and take Coumadin.  05/11/17: Inject Lovenox in the fatty tissue every 24 hours, 8am and take Coumadin.  05/12/17: Inject Lovenox in the fatty tissue every 24 hours, 8am and take Coumadin.  05/13/17: Inject Lovenox in the fatty tissue every 24 hours, 8am and take Coumadin.  05/14/17: Coumadin appt to check INR.

## 2017-05-01 ENCOUNTER — Other Ambulatory Visit: Payer: Self-pay | Admitting: Pharmacist

## 2017-05-01 MED ORDER — ENOXAPARIN SODIUM 60 MG/0.6ML ~~LOC~~ SOLN: 60.0000 mg | SUBCUTANEOUS | 1 refills | Status: DC

## 2017-05-07 ENCOUNTER — Telehealth: Payer: Self-pay | Admitting: Cardiology

## 2017-05-07 NOTE — Telephone Encounter (Signed)
Craig Dixon called to advise of Lovenox/coumadin dosing planned around procedure tomorrow morning, just an [place]. Dosed by coumadin clinic. Patient received last dose of Lovenox 10/23. No Lovenox today or tomorrow, 10/24 and 10/25. Dr. Venia Minks will determine if restarting his coumadin 10/25 pm after procedure. Plan for 10/26 and 10/27 Lovenox at 8 am and 2 mg of coumadin 10/28, 10/29, 10/30 Lovenox and 1.5 mg of coumadin 10/31 pt/inr check and determine dosing from there.

## 2017-05-07 NOTE — Telephone Encounter (Signed)
Please call patient's daughter regarding his PT and Louvenox and Coumadin after surgery.Marland KitchenMarland Kitchen

## 2017-05-10 ENCOUNTER — Telehealth: Payer: Self-pay | Admitting: Physician Assistant

## 2017-05-10 NOTE — Telephone Encounter (Signed)
Paged by answering service. Patient had a bladder cancer surgery on Thursday. He was treated with Lovenox bridge and Coumadin prior to surgery and resume on Friday morning. Noted left arm bruising since this morning. He has no pain, numbness or tingling, hematuria, melena or blood in his stool or urine. Advised to continue to observe. If worsening bruising or any sign of bleeding, the patient will call us.

## 2017-05-11 ENCOUNTER — Telehealth: Payer: Self-pay | Admitting: Physician Assistant

## 2017-05-11 NOTE — Telephone Encounter (Signed)
Daughter paged again for worsen arm bruise and new bruise at lovenox site. INR of 3.0 on home machine. Use for > 3 months. Advised to stop Lovenox and continue coumadin as directed. Check INR tomorrow and call for further instruction. Plan reviewed with Dr. Curt Bears.

## 2017-05-12 ENCOUNTER — Ambulatory Visit (INDEPENDENT_AMBULATORY_CARE_PROVIDER_SITE_OTHER): Payer: Medicare Other | Admitting: Internal Medicine

## 2017-05-12 ENCOUNTER — Telehealth: Payer: Self-pay | Admitting: Cardiology

## 2017-05-12 DIAGNOSIS — Z7901 Long term (current) use of anticoagulants: Secondary | ICD-10-CM | POA: Diagnosis not present

## 2017-05-12 DIAGNOSIS — Z01810 Encounter for preprocedural cardiovascular examination: Secondary | ICD-10-CM

## 2017-05-12 DIAGNOSIS — Z952 Presence of prosthetic heart valve: Secondary | ICD-10-CM

## 2017-05-12 LAB — POCT INR: INR: 3.4

## 2017-05-12 NOTE — Telephone Encounter (Signed)
Discusses with Dr. Bettina Gavia and advised for patient to continue coumadin and stop Lovenox. Advised to to call coumadin clinic with further dosing instructions. Jaceline verbalized understanding.

## 2017-05-12 NOTE — Telephone Encounter (Signed)
Pt had a little bit of issues with his Lovanox bridging over the weekend and talked to the doctor on call and just wanted to call and give you an update

## 2017-05-15 ENCOUNTER — Other Ambulatory Visit: Payer: Self-pay | Admitting: *Deleted

## 2017-05-15 MED ORDER — METOPROLOL SUCCINATE ER 25 MG PO TB24: 25.0000 mg | ORAL_TABLET | ORAL | 0 refills | Status: DC

## 2017-05-19 ENCOUNTER — Ambulatory Visit (INDEPENDENT_AMBULATORY_CARE_PROVIDER_SITE_OTHER): Payer: Medicare Other

## 2017-05-19 DIAGNOSIS — Z01810 Encounter for preprocedural cardiovascular examination: Secondary | ICD-10-CM

## 2017-05-19 DIAGNOSIS — Z7901 Long term (current) use of anticoagulants: Secondary | ICD-10-CM | POA: Diagnosis not present

## 2017-05-19 DIAGNOSIS — Z952 Presence of prosthetic heart valve: Secondary | ICD-10-CM | POA: Diagnosis not present

## 2017-05-19 LAB — POCT INR: INR: 7.2

## 2017-05-21 ENCOUNTER — Telehealth: Payer: Self-pay | Admitting: Cardiology

## 2017-05-21 LAB — PROTIME-INR
INR: 18.5 — AB (ref 0.9–1.1)
INR: 2.9 — AB (ref 0.9–1.1)

## 2017-05-21 NOTE — Telephone Encounter (Signed)
Received call from family member for patient, they were instructed his INR was 18.5 when checked today. No active bleeding reported. Has not had his Coumadin in 2 days and levels continue to rise. Instructed to take the patient to the ED emergently.

## 2017-05-25 LAB — POCT INR: INR: 3.3

## 2017-05-26 ENCOUNTER — Ambulatory Visit (INDEPENDENT_AMBULATORY_CARE_PROVIDER_SITE_OTHER): Payer: Medicare Other | Admitting: Internal Medicine

## 2017-05-26 DIAGNOSIS — Z7901 Long term (current) use of anticoagulants: Secondary | ICD-10-CM | POA: Diagnosis not present

## 2017-05-26 DIAGNOSIS — Z01810 Encounter for preprocedural cardiovascular examination: Secondary | ICD-10-CM

## 2017-05-26 DIAGNOSIS — Z5181 Encounter for therapeutic drug level monitoring: Secondary | ICD-10-CM

## 2017-05-26 DIAGNOSIS — I480 Paroxysmal atrial fibrillation: Secondary | ICD-10-CM | POA: Diagnosis not present

## 2017-05-26 DIAGNOSIS — Z952 Presence of prosthetic heart valve: Secondary | ICD-10-CM

## 2017-05-26 NOTE — Progress Notes (Signed)
Spoke with daughter Kennyth Lose and she states they have new strips and instructed to continue on same dose coumadin  1.5mg  daily except 1mg  on Saturday Recheck INR 1 week 06/02/2017

## 2017-06-02 ENCOUNTER — Ambulatory Visit (INDEPENDENT_AMBULATORY_CARE_PROVIDER_SITE_OTHER): Payer: Medicare Other | Admitting: Internal Medicine

## 2017-06-02 DIAGNOSIS — Z01810 Encounter for preprocedural cardiovascular examination: Secondary | ICD-10-CM

## 2017-06-02 DIAGNOSIS — Z7901 Long term (current) use of anticoagulants: Secondary | ICD-10-CM | POA: Diagnosis not present

## 2017-06-02 DIAGNOSIS — Z952 Presence of prosthetic heart valve: Secondary | ICD-10-CM | POA: Diagnosis not present

## 2017-06-02 LAB — POCT INR: INR: 3

## 2017-06-09 ENCOUNTER — Ambulatory Visit (INDEPENDENT_AMBULATORY_CARE_PROVIDER_SITE_OTHER): Payer: Medicare Other | Admitting: Internal Medicine

## 2017-06-09 ENCOUNTER — Ambulatory Visit: Payer: Medicare Other | Admitting: Cardiology

## 2017-06-09 DIAGNOSIS — Z7901 Long term (current) use of anticoagulants: Secondary | ICD-10-CM | POA: Diagnosis not present

## 2017-06-09 DIAGNOSIS — Z01810 Encounter for preprocedural cardiovascular examination: Secondary | ICD-10-CM

## 2017-06-09 DIAGNOSIS — Z952 Presence of prosthetic heart valve: Secondary | ICD-10-CM

## 2017-06-09 LAB — POCT INR: INR: 3.9

## 2017-06-09 NOTE — Progress Notes (Deleted)
Cardiology Office Note:    Date:  06/09/2017   ID:  Craig Dixon, DOB Jan 22, 1941, MRN [id number]  PCP:  Raelyn Number, MD  Cardiologist:  Shirlee More, MD    Referring MD: Raelyn Number, MD    ASSESSMENT:    1. Chronic systolic heart failure (HCC)   2. Paroxysmal atrial fibrillation ([place])   3. Chronic anticoagulation   4. H/O heart valve replacement with mechanical valve   5. Hyperlipidemia, unspecified hyperlipidemia type   6. ICD (implantable cardioverter-defibrillator) in place   7. 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 Coronary artery disease involving native coronary artery of native heart with angina pectoris (HCC)    PLAN:    In order of problems listed above:  1. ***   Next appointment: ***   Medication Adjustments/Labs and Tests Ordered: Current medicines are reviewed at length with the patient today.  Concerns regarding medicines are outlined above.  No orders of the defined types were placed in this encounter.  No orders of the defined types were placed in this encounter.   No chief complaint on file.   History of Present Illness:    Craig Dixon is a 76 y.o. male with a hx of  CAD,systolic CHF,chronic Atrial Fibrillation, Dyslipidemia, Peripheral Vascular Disease with CLI, S/P CABG, Valvular heart disease with a St Jude AVR and an ICDand bladder cancer with recent TURB  last seen 2 months ago.. Compliance with diet, lifestyle and medications: *** Past Medical History:  Diagnosis Date  . Arthritis   . Atherosclerosis of native arteries of extremity with rest pain ([place]) 08/17/2015  . Cachexia ([place]) 03/04/2017  . Carotid stenosis 01/18/2015   Overview:  Severe asymptomatic L ICA stenosis  . Cataract   . Chronic anticoagulation 02/20/2017  . Chronic kidney disease   . Chronic systolic heart failure ([place]) 01/18/2015  . CKD (chronic kidney disease), stage III 01/18/2015  . COPD (chronic obstructive  pulmonary disease) ([place])   . Coronary artery disease involving native coronary artery of native heart with angina pectoris ([place]) 01/18/2015  . Diabetes mellitus without complication ([place])   . Diabetic polyneuropathy associated with type 2 diabetes mellitus ([place]) 03/04/2017  . Encounter for therapeutic drug monitoring 02/20/2017  . GERD (gastroesophageal reflux disease)   . H/O heart valve replacement with mechanical valve 01/18/2015   Overview:  St Jude AVR  . History of leg amputation ([place]) 02/18/2016  . Hyperlipidemia 03/04/2017  . Hypertension   . 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 Hypokalemia 03/04/2017  . Hypothyroidism (acquired) 03/04/2017  . ICD (implantable cardioverter-defibrillator) in place 01/25/2016  . LBBB (left bundle branch block) 03/04/2017  . Onychomycosis due to dermatophyte 02/18/2016  . PAD (peripheral artery disease) ([place]) 07/12/2015   Overview:  With R AKA and LLE CLI,: Based on his angiographic findings, this patient needs: palliative mgmt vs L AKA. I discussed in depth with the patient the nature of atherosclerosis, and emphasized the importance of maximal medical management including strict control of blood pressure, blood glucose, and lipid levels, obtaining regular exercise, and cessation of smoking. The patient is aware that without maximal medical management the underlying atherosclerotic disease process will progress, limiting the benefit of any interventions.  . Paroxysmal atrial fibrillation ([place]) 12/17/2016  . Peripheral arterial occlusive disease ([place]) 02/18/2016  . Reflux esophagitis 03/04/2017  . Thyroid disease     Past  Surgical History:  Procedure Laterality Date  . BELOW KNEE LEG AMPUTATION Right 2001  . CARDIAC VALVE REPLACEMENT    . CORONARY ARTERY BYPASS GRAFT    . EP IMPLANTABLE DEVICE    . leg ampuation  2001   right leg  . pace maker  Aug. 2014  . PACEMAKER INSERTION    . PERIPHERAL  VASCULAR CATHETERIZATION N/A 08/17/2015   Procedure: Abdominal Aortogram w/Lower Extremity;  Surgeon: Conrad Marion, MD;  Location: [facility];  Service: Cardiovascular;  Laterality: N/A;    Current Medications: No outpatient medications have been marked as taking for the 06/09/17 encounter (Appointment) with Richardo Priest, MD.     Allergies:   Clonidine; Gabapentin; Hydralazine; Nsaids; Talwin [pentazocine]; Tape; and Tolmetin   Social History   Socioeconomic History  . Marital status: Single    Spouse name: Not on file  . Number of children: Not on file  . Years of education: Not on file  . Highest education level: Not on file  Social Needs  . Financial resource strain: Not on file  . Food insecurity - worry: Not on file  . Food insecurity - inability: Not on file  . Transportation needs - medical: Not on file  . Transportation needs - non-medical: Not on file  Occupational History  . Not on file  Tobacco Use  . Smoking status: Current Every Day Smoker    Packs/day: 1.00  . Smokeless tobacco: Never Used  Substance and Sexual Activity  . Alcohol use: No    Alcohol/week: 0.0 oz  . Drug use: No  . Sexual activity: Not on file  Other Topics Concern  . Not on file  Social History Narrative  . Not on file     Family History: The patient's ***family history includes Heart attack in his father; Heart disease in his father, sister, and son. ROS:   Please see the history of present illness.    All other systems reviewed and are negative.  EKGs/Labs/Other Studies Reviewed:    The following studies were reviewed today:  EKG:  EKG ordered today.  The ekg ordered today demonstrates ***  Recent Labs: 05/07/17: K 3.8 cr 1.69 GFR 40 cc/min, Hgb 14.9 INR 3.0 on 06/02/17 03/06/2017: ALT 10; BNP 2,037.3; BUN 37; Creatinine, Ser 1.97; Potassium 4.7; Sodium 140  Recent Lipid Panel 11/20/16: LDL 74 No results found for: CHOL, TRIG, HDL, CHOLHDL, VLDL, LDLCALC,  LDLDIRECT  Physical Exam:    VS:  There were no vitals taken for this visit.    Wt Readings from Last 3 Encounters:  04/04/17 125 lb (56.7 kg)  03/06/17 120 lb (54.4 kg)  09/15/15 125 lb (56.7 kg)     GEN: *** Well nourished, well developed in no acute distress HEENT: Normal NECK: No JVD; No carotid bruits LYMPHATICS: No lymphadenopathy CARDIAC: ***RRR, no murmurs, rubs, gallops RESPIRATORY:  Clear to auscultation without rales, wheezing or rhonchi  ABDOMEN: Soft, non-tender, non-distended MUSCULOSKELETAL:  No edema; No deformity  SKIN: Warm and dry NEUROLOGIC:  Alert and oriented x 3 PSYCHIATRIC:  Normal affect    Signed, Shirlee More, MD  06/09/2017 7:55 AM    [place]

## 2017-06-16 ENCOUNTER — Ambulatory Visit (INDEPENDENT_AMBULATORY_CARE_PROVIDER_SITE_OTHER): Payer: Medicare Other | Admitting: Cardiology

## 2017-06-16 DIAGNOSIS — Z952 Presence of prosthetic heart valve: Secondary | ICD-10-CM | POA: Diagnosis not present

## 2017-06-16 DIAGNOSIS — Z01810 Encounter for preprocedural cardiovascular examination: Secondary | ICD-10-CM

## 2017-06-16 DIAGNOSIS — Z7901 Long term (current) use of anticoagulants: Secondary | ICD-10-CM

## 2017-06-16 LAB — POCT INR: INR: 3.6

## 2017-06-25 ENCOUNTER — Ambulatory Visit (INDEPENDENT_AMBULATORY_CARE_PROVIDER_SITE_OTHER): Payer: Medicare Other

## 2017-06-25 DIAGNOSIS — Z7901 Long term (current) use of anticoagulants: Secondary | ICD-10-CM | POA: Diagnosis not present

## 2017-06-25 DIAGNOSIS — Z952 Presence of prosthetic heart valve: Secondary | ICD-10-CM

## 2017-06-25 DIAGNOSIS — Z01810 Encounter for preprocedural cardiovascular examination: Secondary | ICD-10-CM

## 2017-06-25 LAB — POCT INR: INR: 3

## 2017-06-25 NOTE — Patient Instructions (Signed)
Spoke with daughter Kennyth Lose, advised to have pt continue on same dosage 1.5mg  daily except 1mg  on Saturdays.  Recheck INR 2 weeks.

## 2017-07-09 ENCOUNTER — Ambulatory Visit (INDEPENDENT_AMBULATORY_CARE_PROVIDER_SITE_OTHER): Payer: Medicare Other | Admitting: Internal Medicine

## 2017-07-09 DIAGNOSIS — Z01810 Encounter for preprocedural cardiovascular examination: Secondary | ICD-10-CM

## 2017-07-09 DIAGNOSIS — Z952 Presence of prosthetic heart valve: Secondary | ICD-10-CM | POA: Diagnosis not present

## 2017-07-09 DIAGNOSIS — Z7901 Long term (current) use of anticoagulants: Secondary | ICD-10-CM | POA: Diagnosis not present

## 2017-07-09 LAB — POCT INR: INR: 2.6

## 2017-07-23 ENCOUNTER — Ambulatory Visit (INDEPENDENT_AMBULATORY_CARE_PROVIDER_SITE_OTHER): Payer: Medicare Other

## 2017-07-23 DIAGNOSIS — Z01810 Encounter for preprocedural cardiovascular examination: Secondary | ICD-10-CM

## 2017-07-23 DIAGNOSIS — Z7901 Long term (current) use of anticoagulants: Secondary | ICD-10-CM | POA: Diagnosis not present

## 2017-07-23 DIAGNOSIS — Z952 Presence of prosthetic heart valve: Secondary | ICD-10-CM | POA: Diagnosis not present

## 2017-07-23 LAB — POCT INR: INR: 3.3

## 2017-07-23 NOTE — Patient Instructions (Signed)
Description   Spoke with daughter Kennyth Lose, advised to have pt continue on same dosage 1.5mg  daily except 1mg  on Saturdays.  Recheck INR 2 weeks.

## 2017-07-25 NOTE — Progress Notes (Signed)
Cardiology Office Note:    Date:  07/28/2017   ID:  Craig Dixon, DOB Nov 30, 1940, MRN [id number]  PCP:  Raelyn Number, MD  Cardiologist:  Shirlee More, MD    Referring MD: Raelyn Number, MD    ASSESSMENT:    1. Chronic systolic heart failure (HCC)   2. Persistent atrial fibrillation ([place])   3. LBBB (left bundle branch block)   4. 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 Coronary artery disease involving native coronary artery of native heart with angina pectoris ([place])   6. Chronic anticoagulation   7. H/O heart valve replacement with mechanical valve   8. ICD (implantable cardioverter-defibrillator) in place   9. Hyperlipidemia, unspecified hyperlipidemia type    PLAN:    In order of problems listed above:  1. Stable he has done remarkably well considering the severity of his heart disease and comorbidities.  He will be transition from ACE inhibitor to [facility] with a 5-day gap and will continue his loop diuretic and minimum dose of beta-blocker that he tolerates hemodynamically.  He is not on MRA because of CKD and previous hyperkalemia.  Labs done last week requested from his PCP including BMP 2. Stable rate controlled continue beta-blocker 3. Stable 4. Stable blood pressure continue vasodilator beta-blocker diuretic and access recent labs done with his PCP for CKD 5. Stable continue medical treatment he is on a beta-blocker and high intensity statin 6. Stable continue warfarin goal INR is 3 7. Stable Saint Jude aortic valve 8. Stable function, referred to EP care 9. Stable continue statin await recent labs for liver function toxicity LDL efficacy with high intensity statin and CABG   Next appointment: 3 months   Medication Adjustments/Labs and Tests Ordered: Current medicines are reviewed at length with the patient today.  Concerns regarding medicines are outlined above.  No orders of the defined types were placed  in this encounter.  No orders of the defined types were placed in this encounter.   Chief Complaint  Patient presents with  . Congestive Heart Failure  . Atrial Fibrillation  . Coronary Artery Disease  . Anticoagulation    with St Jude AVR    History of Present Illness:    Craig Dixon is a 77 y.o. male with a hx of CAD,systolic CHF,chronic atrial Fibrillation, Dyslipidemia, Peripheral Vascular Disease with CLI, S/P CABG, Valvular heart disease with a St Jude AVR and an ICD last seen in September 2018 before GU surgery for bladder cancer. Recently had device alert with sustained atrial fibrillation. Compliance with diet, lifestyle and medications: Yes He is really doing quite well he had his urologic procedures without complication has had no further hematuria.  His daughter meticulously supervises and manages him he has had no edema shortness of breath chest pain palpitations syncope or TIA.  He received a phone call that he is having atrial fibrillation device check I reviewed my records and is my impression he is in persistent or chronic A. fib is his baseline rhythm.  They request to relocate his device and EP care to my practice consultation was made.  Warfarin is managed by Coumadin clinic Past Medical History:  Diagnosis Date  . Arthritis   . Atherosclerosis of native arteries of extremity with rest pain ([place]) 08/17/2015  . Cachexia ([place]) 03/04/2017  . Carotid stenosis 01/18/2015   Overview:  Severe asymptomatic L ICA stenosis  . Cataract   . Chronic anticoagulation  02/20/2017  . Chronic kidney disease   . Chronic systolic heart failure ([place]) 01/18/2015  . CKD (chronic kidney disease), stage III ([place]) 01/18/2015  . COPD (chronic obstructive pulmonary disease) ([place])   . Coronary artery disease involving native coronary artery of native heart with angina pectoris ([place]) 01/18/2015  . Diabetes mellitus without complication ([place])   . Diabetic polyneuropathy associated with type 2 diabetes  mellitus ([place]) 03/04/2017  . Encounter for therapeutic drug monitoring 02/20/2017  . GERD (gastroesophageal reflux disease)   . H/O heart valve replacement with mechanical valve 01/18/2015   Overview:  St Jude AVR  . History of leg amputation ([place]) 02/18/2016  . Hyperlipidemia 03/04/2017  . Hypertension   . 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 Hypokalemia 03/04/2017  . Hypothyroidism (acquired) 03/04/2017  . ICD (implantable cardioverter-defibrillator) in place 01/25/2016  . LBBB (left bundle branch block) 03/04/2017  . Onychomycosis due to dermatophyte 02/18/2016  . PAD (peripheral artery disease) ([place]) 07/12/2015   Overview:  With R AKA and LLE CLI,: Based on his angiographic findings, this patient needs: palliative mgmt vs L AKA. I discussed in depth with the patient the nature of atherosclerosis, and emphasized the importance of maximal medical management including strict control of blood pressure, blood glucose, and lipid levels, obtaining regular exercise, and cessation of smoking. The patient is aware that without maximal medical management the underlying atherosclerotic disease process will progress, limiting the benefit of any interventions.  . Paroxysmal atrial fibrillation ([place]) 12/17/2016  . Peripheral arterial occlusive disease ([place]) 02/18/2016  . Reflux esophagitis 03/04/2017  . Thyroid disease     Past Surgical History:  Procedure Laterality Date  . BELOW KNEE LEG AMPUTATION Right 2001  . CARDIAC VALVE REPLACEMENT    . CORONARY ARTERY BYPASS GRAFT    . EP IMPLANTABLE DEVICE    . leg ampuation  2001   right leg  . pace maker  Aug. 2014  . PACEMAKER INSERTION    . PERIPHERAL VASCULAR CATHETERIZATION N/A 08/17/2015   Procedure: Abdominal Aortogram w/Lower Extremity;  Surgeon: Conrad Toomsuba, MD;  Location: [facility];  Service: Cardiovascular;  Laterality: N/A;    Current Medications: Current Meds  Medication Sig  .  amitriptyline (ELAVIL) 50 MG tablet 50 mg every evening  . atorvastatin (LIPITOR) 40 MG tablet Take 40 mg by mouth daily at 6 PM.   . COUMADIN 1 MG tablet Take 1.5 mg by mouth as directed. Currently taking 1.5mg  everyday except 1 mg on Sat  . docusate sodium (COLACE) 100 MG capsule Take 100 mg by mouth 2 (two) times daily.  . furosemide (LASIX) 40 MG tablet Take 40 mg by mouth daily. Reported on 08/09/2015  . isosorbide mononitrate (IMDUR) 60 MG 24 hr tablet Take 60 mg by mouth daily. Reported on 08/09/2015  . levothyroxine (SYNTHROID, LEVOTHROID) 112 MCG tablet Take 112 mcg by mouth daily before breakfast.   . lisinopril (PRINIVIL,ZESTRIL) 5 MG tablet Take 0.5 tablets (2.5 mg total) by mouth every Monday, Wednesday, and Friday.  . metFORMIN (GLUCOPHAGE) 500 MG tablet Take 1 tablet by mouth 2 (two) times daily.  . metoprolol succinate (TOPROL-XL) 25 MG 24 hr tablet Take 1 tablet (25 mg total) by mouth 4 (four) times a week. 25 mg on Tues, Thurs, Sat, and Sun  . mupirocin ointment (BACTROBAN) 2 % Apply 1 application topically daily as needed (for wound care).   . nitroGLYCERIN (NITROSTAT) 0.4 MG SL  tablet DISSOLVE 1 TABLET UNDER THE TONGUE EVERY 5 MINUTES AS NEEDED FOR CHEST PAIN. (MAXIMUM OF 3 DOSES)  . OVER THE COUNTER MEDICATION Apply 1 application topically daily.  Marland Kitchen oxyCODONE-acetaminophen (PERCOCET) 10-325 MG tablet Take 1 tablet by mouth every 8 (eight) hours as needed for pain.  . polyethylene glycol powder (GLYCOLAX/MIRALAX) powder Take 17 g by mouth at bedtime as needed for mild constipation.   . Wound Dressings (SOLOSITE WOUND GEL) GEL Apply 1 application topically daily as needed (for would care).     Allergies:   Clonidine; Gabapentin; Hydralazine; Nsaids; Talwin [pentazocine]; Tape; and Tolmetin   Social History   Socioeconomic History  . Marital status: Single    Spouse name: None  . Number of children: None  . Years of education: None  . Highest education level: None  Social  Needs  . Financial resource strain: None  . Food insecurity - worry: None  . Food insecurity - inability: None  . Transportation needs - medical: None  . Transportation needs - non-medical: None  Occupational History  . None  Tobacco Use  . Smoking status: Current Every Day Smoker    Packs/day: 1.00  . Smokeless tobacco: Never Used  Substance and Sexual Activity  . Alcohol use: No    Alcohol/week: 0.0 oz  . Drug use: No  . Sexual activity: None  Other Topics Concern  . None  Social History Narrative  . None     Family History: The patient's family history includes Heart attack in his father; Heart disease in his father, sister, and son. ROS:   Please see the history of present illness.    All other systems reviewed and are negative.  EKGs/Labs/Other Studies Reviewed:    The following studies were reviewed today  Recent Labs: 03/06/2017: ALT 10; BNP 2,037.3; BUN 37; Creatinine, Ser 1.97; Potassium 4.7; Sodium 140  Recent Lipid Panel No results found for: CHOL, TRIG, HDL, CHOLHDL, VLDL, LDLCALC, LDLDIRECT  Physical Exam:    VS:  BP 104/62 (BP Location: Left Arm, Patient Position: Sitting)     Wt Readings from Last 3 Encounters:  04/04/17 125 lb (56.7 kg)  03/06/17 120 lb (54.4 kg)  09/15/15 125 lb (56.7 kg)     GEN: As always he looks very frail debilitated chronically ill and malnourished well nourished, well developed in no acute distress HEENT: Normal NECK: No JVD; No carotid bruits LYMPHATICS: No lymphadenopathy CARDIAC: Irregular rhythm variable first heart sound closing snap is heard with Saint Jude valve 1/6 aortic outflow murmur no AR  rubs, gallops RESPIRATORY:  Clear to auscultation without rales, wheezing or rhonchi  ABDOMEN: Soft, non-tender, non-distended MUSCULOSKELETAL:  No edema; No deformity  SKIN: Warm and dry NEUROLOGIC:  Alert and oriented x 3 PSYCHIATRIC:  Normal affect    Signed, Shirlee More, MD  07/28/2017 2:45 PM    [facility]

## 2017-07-28 ENCOUNTER — Encounter: Payer: Self-pay | Admitting: Cardiology

## 2017-07-28 ENCOUNTER — Ambulatory Visit (INDEPENDENT_AMBULATORY_CARE_PROVIDER_SITE_OTHER): Payer: Medicare Other | Admitting: Cardiology

## 2017-07-28 VITALS — BP 104/62

## 2017-07-28 DIAGNOSIS — I5022 Chronic systolic (congestive) heart failure: Secondary | ICD-10-CM

## 2017-07-28 DIAGNOSIS — Z7901 Long term (current) use of anticoagulants: Secondary | ICD-10-CM | POA: Diagnosis not present

## 2017-07-28 DIAGNOSIS — I130 Hypertensive heart and chronic kidney disease with heart failure and stage 1 through stage 4 chronic kidney disease, or unspecified chronic kidney disease: Secondary | ICD-10-CM | POA: Diagnosis not present

## 2017-07-28 DIAGNOSIS — I447 Left bundle-branch block, unspecified: Secondary | ICD-10-CM

## 2017-07-28 DIAGNOSIS — I481 Persistent atrial fibrillation: Secondary | ICD-10-CM

## 2017-07-28 DIAGNOSIS — I25119 Atherosclerotic heart disease of native coronary artery with unspecified angina pectoris: Secondary | ICD-10-CM

## 2017-07-28 DIAGNOSIS — Z95810 Presence of automatic (implantable) cardiac defibrillator: Secondary | ICD-10-CM

## 2017-07-28 DIAGNOSIS — Z952 Presence of prosthetic heart valve: Secondary | ICD-10-CM | POA: Diagnosis not present

## 2017-07-28 DIAGNOSIS — E785 Hyperlipidemia, unspecified: Secondary | ICD-10-CM | POA: Diagnosis not present

## 2017-07-28 DIAGNOSIS — I4819 Other persistent atrial fibrillation: Secondary | ICD-10-CM

## 2017-07-28 MED ORDER — SACUBITRIL-VALSARTAN 24-26 MG PO TABS: 1.0000 | ORAL_TABLET | ORAL | 3 refills | Status: DC

## 2017-07-28 NOTE — Patient Instructions (Addendum)
Medication Instructions:  Your physician has recommended you make the following change in your medication:  STOP lisinopril Friday 08/01/17 START sacubitril-valsartan (Entresto) 24-26 mg next Wednesday 08/06/17. You will take this medication Monday, Wednesday, Friday  Labwork: None  Testing/Procedures: None  Follow-Up: Your physician recommends that you schedule a follow-up appointment in: 3 months.  You will receive a phone call to schedule with Dr. Curt Bears.  Any Other Special Instructions Will Be Listed Below (If Applicable).     If you need a refill on your cardiac medications before your next appointment, please call your pharmacy.    Last dose of lisinopril Friday, first dose of Entresto the next Wednesday

## 2017-08-06 ENCOUNTER — Ambulatory Visit (INDEPENDENT_AMBULATORY_CARE_PROVIDER_SITE_OTHER): Payer: Medicare Other | Admitting: Cardiology

## 2017-08-06 DIAGNOSIS — Z7901 Long term (current) use of anticoagulants: Secondary | ICD-10-CM

## 2017-08-06 DIAGNOSIS — I481 Persistent atrial fibrillation: Secondary | ICD-10-CM | POA: Diagnosis not present

## 2017-08-06 DIAGNOSIS — Z5181 Encounter for therapeutic drug level monitoring: Secondary | ICD-10-CM | POA: Diagnosis not present

## 2017-08-06 DIAGNOSIS — Z01810 Encounter for preprocedural cardiovascular examination: Secondary | ICD-10-CM

## 2017-08-06 DIAGNOSIS — Z952 Presence of prosthetic heart valve: Secondary | ICD-10-CM

## 2017-08-06 DIAGNOSIS — I4819 Other persistent atrial fibrillation: Secondary | ICD-10-CM

## 2017-08-06 LAB — POCT INR: INR: 2.3

## 2017-08-11 ENCOUNTER — Telehealth: Payer: Self-pay | Admitting: Cardiology

## 2017-08-11 NOTE — Telephone Encounter (Signed)
Jaceline advised okay to stop Entresto, but most be off it for 3 full days before starting lisinopril. Jaceline verbalized understanding. No further questions.

## 2017-08-11 NOTE — Telephone Encounter (Signed)
OK but needs to be off entesto 3 full days to avoid angioedema

## 2017-08-11 NOTE — Telephone Encounter (Signed)
Craig Dixon would like a call to speak about medications.

## 2017-08-11 NOTE — Telephone Encounter (Signed)
Patient took first dose of Entresto last Wednesday, second dose on Friday. Patient has been feeling weak, light-headed, and trembling.  Geni Bers states that the patient did so well with the lisinopril, and if the [facility] may be too much, she would like for him to go back on lisinopril.  Please advise.

## 2017-08-20 ENCOUNTER — Encounter: Payer: Medicare Other | Admitting: Cardiology

## 2017-08-20 ENCOUNTER — Ambulatory Visit (INDEPENDENT_AMBULATORY_CARE_PROVIDER_SITE_OTHER): Payer: Medicare Other | Admitting: Interventional Cardiology

## 2017-08-20 DIAGNOSIS — Z952 Presence of prosthetic heart valve: Secondary | ICD-10-CM

## 2017-08-20 DIAGNOSIS — Z01810 Encounter for preprocedural cardiovascular examination: Secondary | ICD-10-CM | POA: Diagnosis not present

## 2017-08-20 DIAGNOSIS — Z7901 Long term (current) use of anticoagulants: Secondary | ICD-10-CM | POA: Diagnosis not present

## 2017-08-20 LAB — POCT INR: INR: 6

## 2017-08-20 NOTE — Patient Instructions (Signed)
Description   Spoke with daughter Kennyth Lose, advised to have pt not to take any Coumadin today, No Coumadin tomorrow, and hold Friday's dose then recheck INR on Friday.  Normal dose: continue on same dosage 1.5mg  daily except 1mg  on Saturdays.  Recheck INR on Friday.

## 2017-08-22 ENCOUNTER — Ambulatory Visit (INDEPENDENT_AMBULATORY_CARE_PROVIDER_SITE_OTHER): Payer: Medicare Other | Admitting: Internal Medicine

## 2017-08-22 DIAGNOSIS — Z01810 Encounter for preprocedural cardiovascular examination: Secondary | ICD-10-CM | POA: Diagnosis not present

## 2017-08-22 DIAGNOSIS — Z952 Presence of prosthetic heart valve: Secondary | ICD-10-CM | POA: Diagnosis not present

## 2017-08-22 DIAGNOSIS — Z7901 Long term (current) use of anticoagulants: Secondary | ICD-10-CM | POA: Diagnosis not present

## 2017-08-22 LAB — POCT INR: INR: 3

## 2017-08-27 ENCOUNTER — Ambulatory Visit (INDEPENDENT_AMBULATORY_CARE_PROVIDER_SITE_OTHER): Payer: Medicare Other | Admitting: Cardiovascular Disease

## 2017-08-27 DIAGNOSIS — I481 Persistent atrial fibrillation: Secondary | ICD-10-CM | POA: Diagnosis not present

## 2017-08-27 DIAGNOSIS — Z7901 Long term (current) use of anticoagulants: Secondary | ICD-10-CM

## 2017-08-27 DIAGNOSIS — Z952 Presence of prosthetic heart valve: Secondary | ICD-10-CM | POA: Diagnosis not present

## 2017-08-27 DIAGNOSIS — I4819 Other persistent atrial fibrillation: Secondary | ICD-10-CM

## 2017-08-27 DIAGNOSIS — Z01810 Encounter for preprocedural cardiovascular examination: Secondary | ICD-10-CM

## 2017-08-27 LAB — POCT INR: INR: 2.2

## 2017-08-27 NOTE — Patient Instructions (Signed)
Description   Spoke with daughter Kennyth Lose, advised to have pt take coumadin 2mg  today Feb 13th then continue on same dosage 1.5mg  daily except 1mg  on Saturdays.  Recheck INR on Wednesday. 09/03/2017

## 2017-09-03 ENCOUNTER — Ambulatory Visit (INDEPENDENT_AMBULATORY_CARE_PROVIDER_SITE_OTHER): Payer: Medicare Other | Admitting: Cardiovascular Disease

## 2017-09-03 DIAGNOSIS — Z952 Presence of prosthetic heart valve: Secondary | ICD-10-CM

## 2017-09-03 DIAGNOSIS — Z7901 Long term (current) use of anticoagulants: Secondary | ICD-10-CM | POA: Diagnosis not present

## 2017-09-03 DIAGNOSIS — Z01810 Encounter for preprocedural cardiovascular examination: Secondary | ICD-10-CM

## 2017-09-03 LAB — POCT INR: INR: 2.2

## 2017-09-10 ENCOUNTER — Ambulatory Visit (INDEPENDENT_AMBULATORY_CARE_PROVIDER_SITE_OTHER): Payer: Medicare Other | Admitting: Internal Medicine

## 2017-09-10 DIAGNOSIS — Z952 Presence of prosthetic heart valve: Secondary | ICD-10-CM | POA: Diagnosis not present

## 2017-09-10 DIAGNOSIS — Z7901 Long term (current) use of anticoagulants: Secondary | ICD-10-CM | POA: Diagnosis not present

## 2017-09-10 DIAGNOSIS — Z01810 Encounter for preprocedural cardiovascular examination: Secondary | ICD-10-CM

## 2017-09-10 LAB — POCT INR: INR: 3.1

## 2017-09-12 ENCOUNTER — Other Ambulatory Visit: Payer: Self-pay

## 2017-09-12 MED ORDER — METOPROLOL SUCCINATE ER 25 MG PO TB24: 25.0000 mg | ORAL_TABLET | ORAL | 0 refills | Status: DC

## 2017-09-17 ENCOUNTER — Encounter: Payer: Medicare Other | Admitting: Cardiology

## 2017-09-17 ENCOUNTER — Encounter: Payer: Self-pay | Admitting: Cardiology

## 2017-09-18 ENCOUNTER — Ambulatory Visit (INDEPENDENT_AMBULATORY_CARE_PROVIDER_SITE_OTHER): Payer: Medicare Other | Admitting: Internal Medicine

## 2017-09-18 DIAGNOSIS — Z5181 Encounter for therapeutic drug level monitoring: Secondary | ICD-10-CM | POA: Diagnosis not present

## 2017-09-18 DIAGNOSIS — Z7901 Long term (current) use of anticoagulants: Secondary | ICD-10-CM

## 2017-09-18 DIAGNOSIS — Z01810 Encounter for preprocedural cardiovascular examination: Secondary | ICD-10-CM

## 2017-09-18 DIAGNOSIS — Z952 Presence of prosthetic heart valve: Secondary | ICD-10-CM

## 2017-09-18 LAB — POCT INR: INR: 1.8

## 2017-09-18 NOTE — Patient Instructions (Signed)
Description   Spoke with daughter Kennyth Lose, advised to have pt take 2mg  today and tomorrow then continue taking 1.5mg  daily.  Recheck INR in one week.

## 2017-09-25 ENCOUNTER — Ambulatory Visit (INDEPENDENT_AMBULATORY_CARE_PROVIDER_SITE_OTHER): Payer: Medicare Other | Admitting: Cardiology

## 2017-09-25 DIAGNOSIS — Z7901 Long term (current) use of anticoagulants: Secondary | ICD-10-CM | POA: Diagnosis not present

## 2017-09-25 DIAGNOSIS — Z952 Presence of prosthetic heart valve: Secondary | ICD-10-CM | POA: Diagnosis not present

## 2017-09-25 DIAGNOSIS — Z01810 Encounter for preprocedural cardiovascular examination: Secondary | ICD-10-CM | POA: Diagnosis not present

## 2017-09-25 LAB — POCT INR: INR: 1.7

## 2017-10-02 ENCOUNTER — Ambulatory Visit (INDEPENDENT_AMBULATORY_CARE_PROVIDER_SITE_OTHER): Payer: Medicare Other | Admitting: Pharmacist

## 2017-10-02 DIAGNOSIS — Z01810 Encounter for preprocedural cardiovascular examination: Secondary | ICD-10-CM | POA: Diagnosis not present

## 2017-10-02 DIAGNOSIS — Z952 Presence of prosthetic heart valve: Secondary | ICD-10-CM

## 2017-10-02 DIAGNOSIS — Z7901 Long term (current) use of anticoagulants: Secondary | ICD-10-CM | POA: Diagnosis not present

## 2017-10-02 LAB — POCT INR: INR: 2.9

## 2017-10-03 NOTE — Patient Instructions (Signed)
Description   Spoke with daughter Kennyth Lose, advised to have pt continue taking 1.5mg  daily except 2 tablets on Mondays.  Recheck INR in 1 week.

## 2017-10-09 ENCOUNTER — Ambulatory Visit (INDEPENDENT_AMBULATORY_CARE_PROVIDER_SITE_OTHER): Payer: Medicare Other | Admitting: Internal Medicine

## 2017-10-09 DIAGNOSIS — Z7901 Long term (current) use of anticoagulants: Secondary | ICD-10-CM

## 2017-10-09 DIAGNOSIS — Z952 Presence of prosthetic heart valve: Secondary | ICD-10-CM | POA: Diagnosis not present

## 2017-10-09 DIAGNOSIS — Z5181 Encounter for therapeutic drug level monitoring: Secondary | ICD-10-CM

## 2017-10-09 DIAGNOSIS — I481 Persistent atrial fibrillation: Secondary | ICD-10-CM | POA: Diagnosis not present

## 2017-10-09 DIAGNOSIS — I4819 Other persistent atrial fibrillation: Secondary | ICD-10-CM

## 2017-10-09 DIAGNOSIS — Z01810 Encounter for preprocedural cardiovascular examination: Secondary | ICD-10-CM

## 2017-10-09 LAB — POCT INR: INR: 2.8

## 2017-10-10 NOTE — Patient Instructions (Signed)
Description   Spoke with daughter Kennyth Lose, advised to have pt continue  taking 1.5mg  daily except 2 mgs on Mondays and Fridays .  Recheck INR in 1 week. for  present time call Kennyth Lose  at [phone]

## 2017-10-13 ENCOUNTER — Ambulatory Visit (INDEPENDENT_AMBULATORY_CARE_PROVIDER_SITE_OTHER): Payer: Medicare Other | Admitting: Cardiology

## 2017-10-13 ENCOUNTER — Encounter: Payer: Self-pay | Admitting: Cardiology

## 2017-10-13 VITALS — BP 110/76 | HR 68 | Ht 68.0 in | Wt 125.0 lb

## 2017-10-13 DIAGNOSIS — Z95810 Presence of automatic (implantable) cardiac defibrillator: Secondary | ICD-10-CM | POA: Diagnosis not present

## 2017-10-13 DIAGNOSIS — I447 Left bundle-branch block, unspecified: Secondary | ICD-10-CM

## 2017-10-13 DIAGNOSIS — I25810 Atherosclerosis of coronary artery bypass graft(s) without angina pectoris: Secondary | ICD-10-CM

## 2017-10-13 DIAGNOSIS — I481 Persistent atrial fibrillation: Secondary | ICD-10-CM | POA: Diagnosis not present

## 2017-10-13 DIAGNOSIS — I4819 Other persistent atrial fibrillation: Secondary | ICD-10-CM

## 2017-10-13 DIAGNOSIS — I5022 Chronic systolic (congestive) heart failure: Secondary | ICD-10-CM

## 2017-10-13 DIAGNOSIS — I10 Essential (primary) hypertension: Secondary | ICD-10-CM

## 2017-10-13 LAB — CUP PACEART INCLINIC DEVICE CHECK
Battery Remaining Longevity: 55 mo
Date Time Interrogation Session: 20190401144755
HighPow Impedance: 63 Ohm
Implantable Lead Implant Date: 20140414
Implantable Lead Location: 753859
Implantable Pulse Generator Implant Date: 20170331
Lead Channel Pacing Threshold Amplitude: 0.5 V
Lead Channel Pacing Threshold Amplitude: 0.75 V
Lead Channel Pacing Threshold Pulse Width: 0.5 ms
Lead Channel Sensing Intrinsic Amplitude: 12 mV
Lead Channel Setting Pacing Amplitude: 2 V
Lead Channel Setting Pacing Amplitude: 2 V
Lead Channel Setting Pacing Pulse Width: 0.5 ms
MDC IDC LEAD IMPLANT DT: 20140414
MDC IDC LEAD LOCATION: 753860
MDC IDC MSMT LEADCHNL RA IMPEDANCE VALUE: 387.5 Ohm
MDC IDC MSMT LEADCHNL RA PACING THRESHOLD PULSEWIDTH: 0.5 ms
MDC IDC MSMT LEADCHNL RA SENSING INTR AMPL: 3.5 mV
MDC IDC MSMT LEADCHNL RV IMPEDANCE VALUE: 600 Ohm
MDC IDC SET LEADCHNL RV SENSING SENSITIVITY: 0.5 mV
MDC IDC STAT BRADY RA PERCENT PACED: 58 %
MDC IDC STAT BRADY RV PERCENT PACED: 13 %
Pulse Gen Serial Number: 7112477

## 2017-10-13 NOTE — Patient Instructions (Addendum)
Medication Instructions:  Your physician recommends that you continue on your current medications as directed. Please refer to the Current Medication list given to you today.  *If you need a refill on your cardiac medications before your next appointment, please call your pharmacy*  Labwork: None ordered  Testing/Procedures: None ordered  Follow-Up: Remote monitoring is used to monitor your Pacemaker or ICD from home. This monitoring reduces the number of office visits required to check your device to one time per year. It allows [place] to keep an eye on the functioning of your device to ensure it is working properly. You are scheduled for a device check from home on 01/12/2018. You may send your transmission at any time that day. If you have a wireless device, the transmission will be sent automatically. After your physician reviews your transmission, you will receive a postcard with your next transmission date.  Your physician wants you to follow-up in: 1 year with Dr. Curt Bears.  You will receive a reminder letter in the mail two months in advance. If you don't receive a letter, please call our office to schedule the follow-up appointment.  Thank you for choosing [facility]!!   Trinidad Curet, RN [phone]

## 2017-10-13 NOTE — Progress Notes (Signed)
Electrophysiology Office Note   Date:  10/13/2017   ID:  Craig Dixon, DOB [birth year], MRN [id number]  PCP:  Raelyn Number, MD  Cardiologist: Bettina Gavia Primary Electrophysiologist:  Constance Haw, MD    Chief Complaint  Patient presents with  . Follow-up    Chronic systolic CHF/Persistent Afib     History of Present Illness: Craig Dixon is a 77 y.o. male who is being seen today for the evaluation of chronic systolic heart failure at the request of Shirlee More. Presenting today for electrophysiology evaluation.  He has a history of chronic systolic heart failure, persistent atrial fibrillation, hypertension, coronary disease, aortic valve replacement.      Today, he denies symptoms of palpitations, chest pain, shortness of breath, orthopnea, PND, lower extremity edema, claudication, dizziness, presyncope, syncope, bleeding, or neurologic sequela. The patient is tolerating medications without difficulties.    Past Medical History:  Diagnosis Date  . Arthritis   . Atherosclerosis of native arteries of extremity with rest pain ([place]) 08/17/2015  . Cachexia ([place]) 03/04/2017  . Carotid stenosis 01/18/2015   Overview:  Severe asymptomatic L ICA stenosis  . Cataract   . Chronic anticoagulation 02/20/2017  . Chronic kidney disease   . Chronic systolic heart failure ([place]) 01/18/2015  . CKD (chronic kidney disease), stage III ([place]) 01/18/2015  . COPD (chronic obstructive pulmonary disease) ([place])   . Coronary artery disease involving native coronary artery of native heart with angina pectoris ([place]) 01/18/2015  . Diabetes mellitus without complication ([place])   . Diabetic polyneuropathy associated with type 2 diabetes mellitus ([place]) 03/04/2017  . Encounter for therapeutic drug monitoring 02/20/2017  . GERD (gastroesophageal reflux disease)   . H/O heart valve replacement with mechanical valve 01/18/2015   Overview:  St Jude AVR  . History of leg amputation ([place]) 02/18/2016  . Hyperlipidemia  03/04/2017  . Hypertension   . 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 Hypokalemia 03/04/2017  . Hypothyroidism (acquired) 03/04/2017  . ICD (implantable cardioverter-defibrillator) in place 01/25/2016  . LBBB (left bundle branch block) 03/04/2017  . Onychomycosis due to dermatophyte 02/18/2016  . PAD (peripheral artery disease) ([place]) 07/12/2015   Overview:  With R AKA and LLE CLI,: Based on his angiographic findings, this patient needs: palliative mgmt vs L AKA. I discussed in depth with the patient the nature of atherosclerosis, and emphasized the importance of maximal medical management including strict control of blood pressure, blood glucose, and lipid levels, obtaining regular exercise, and cessation of smoking. The patient is aware that without maximal medical management the underlying atherosclerotic disease process will progress, limiting the benefit of any interventions.  . Paroxysmal atrial fibrillation ([place]) 12/17/2016  . Peripheral arterial occlusive disease ([place]) 02/18/2016  . Reflux esophagitis 03/04/2017  . Thyroid disease    Past Surgical History:  Procedure Laterality Date  . BELOW KNEE LEG AMPUTATION Right 2001  . CARDIAC VALVE REPLACEMENT    . CORONARY ARTERY BYPASS GRAFT    . EP IMPLANTABLE DEVICE    . leg ampuation  2001   right leg  . pace maker  Aug. 2014  . PACEMAKER INSERTION    . PERIPHERAL VASCULAR CATHETERIZATION N/A 08/17/2015   Procedure: Abdominal Aortogram w/Lower Extremity;  Surgeon: Conrad Cortland, MD;  Location: [facility];  Service: Cardiovascular;  Laterality: N/A;     Current Outpatient Medications  Medication Sig Dispense Refill  . amitriptyline (ELAVIL) 50 MG tablet  50 mg every evening  2  . atorvastatin (LIPITOR) 40 MG tablet Take 40 mg by mouth daily at 6 PM.     . COUMADIN 1 MG tablet Take 1.5 mg by mouth as directed. Currently taking 1.5mg  everyday except 1 mg on Sat    . docusate  sodium (COLACE) 100 MG capsule Take 100 mg by mouth 2 (two) times daily.    . furosemide (LASIX) 40 MG tablet Take 40 mg by mouth daily. Reported on 08/09/2015  3  . isosorbide mononitrate (IMDUR) 60 MG 24 hr tablet Take 60 mg by mouth daily. Reported on 08/09/2015  4  . levothyroxine (SYNTHROID, LEVOTHROID) 88 MCG tablet Take 88 mcg by mouth daily before breakfast.  5  . metoprolol succinate (TOPROL-XL) 25 MG 24 hr tablet Take 1 tablet (25 mg total) by mouth 4 (four) times a week. 25 mg on Tues, Thurs, Sat, and Sun 90 tablet 0  . mupirocin ointment (BACTROBAN) 2 % Apply 1 application topically daily as needed (for wound care).     . nitroGLYCERIN (NITROSTAT) 0.4 MG SL tablet DISSOLVE 1 TABLET UNDER THE TONGUE EVERY 5 MINUTES AS NEEDED FOR CHEST PAIN. (MAXIMUM OF 3 DOSES)  0  . OVER THE COUNTER MEDICATION Apply 1 application topically daily.    Marland Kitchen oxyCODONE-acetaminophen (PERCOCET) 10-325 MG tablet Take 1 tablet by mouth every 8 (eight) hours as needed for pain.  0  . polyethylene glycol powder (GLYCOLAX/MIRALAX) powder Take 17 g by mouth at bedtime as needed for mild constipation.     . sacubitril-valsartan (ENTRESTO) 24-26 MG Take 1 tablet by mouth 3 (three) times a week. On Mon Weds Fri 30 tablet 3  . Wound Dressings (SOLOSITE WOUND GEL) GEL Apply 1 application topically daily as needed (for would care).     No current facility-administered medications for this visit.     Allergies:   Clonidine; Gabapentin; Hydralazine; Nsaids; Talwin [pentazocine]; Tape; and Tolmetin   Social History:  The patient  reports that he has been smoking.  He has been smoking about 1.00 pack per day. He has never used smokeless tobacco. He reports that he does not drink alcohol or use drugs.   Family History:  The patient's family history includes Heart attack in his father; Heart disease in his father, sister, and son.    ROS:  Please see the history of present illness.   Otherwise, review of systems is positive  for, back pain.   All other systems are reviewed and negative.    PHYSICAL EXAM: VS:  BP 110/76   Pulse 68   Ht 5\' 8"  (1.727 m)   Wt 125 lb (56.7 kg)   BMI 19.01 kg/m  , BMI Body mass index is 19.01 kg/m. GEN: Well nourished, well developed, in no acute distress  HEENT: normal  Neck: no JVD, carotid bruits, or masses Cardiac: RRR; no murmurs, rubs, or gallops,no edema  Respiratory:  clear to auscultation bilaterally, normal work of breathing GI: soft, nontender, nondistended, + BS MS: no deformity or atrophy  Skin: warm and dry, device pocket is well healed Neuro:  Strength and sensation are intact Psych: euthymic mood, full affect  EKG:  EKG is ordered today. Personal review of the ekg ordered shows AV paced, PVCs  Device interrogation is reviewed today in detail.  See PaceArt for details.   Recent Labs: 03/06/2017: ALT 10; BNP 2,037.3; BUN 37; Creatinine, Ser 1.97; Potassium 4.7; Sodium 140    Lipid Panel  No results found  for: CHOL, TRIG, HDL, CHOLHDL, VLDL, LDLCALC, LDLDIRECT   Wt Readings from Last 3 Encounters:  10/13/17 125 lb (56.7 kg)  04/04/17 125 lb (56.7 kg)  03/06/17 120 lb (54.4 kg)      Other studies Reviewed: Additional studies/ records that were reviewed today include: Epic notes   ASSESSMENT AND PLAN:  1.  Chronic systolic heart failure: Due to ischemic cardiomyopathy.  Currently on optimal medical therapy with Entresto and Toprol-XL.  Has a Saint Jude dual-chamber ICD in place.  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  We will transfer his remote monitoring to our clinic.  2.  Persistent atrial fibrillation: Currently on Coumadin.  Has had quite a bit of rhythm abnormalities.  With his chronic medical problems, we will hold off on antiarrhythmics at this time.  This patients CHA2DS2-VASc Score and unadjusted Ischemic Stroke Rate (% per year) is equal to 9.7 % stroke rate/year from a score of  6  Above score calculated as 1 point each if present [CHF, HTN, DM, Vascular=MI/PAD/Aortic Plaque, Age if 65-74, or Male] Above score calculated as 2 points each if present [Age > 75, or Stroke/TIA/TE]   3.  Hypertension: Blood pressure well controlled today.  No changes.  4.Coronary artery disease: No current chest pain.  Is status post CABG many years ago.    Current medicines are reviewed at length with the patient today.   The patient does not have concerns regarding his medicines.  The following changes were made today:  none  Labs/ tests ordered today include:  Orders Placed This Encounter  Procedures  . EKG 12-Lead     Disposition:   FU with Will Camnitz 1 year  Signed, Will Meredith Leeds, MD  10/13/2017 12:22 PM     [address] [place] [address] [phone] (office) [phone] (fax)

## 2017-10-16 ENCOUNTER — Ambulatory Visit (INDEPENDENT_AMBULATORY_CARE_PROVIDER_SITE_OTHER): Payer: Medicare Other | Admitting: Interventional Cardiology

## 2017-10-16 DIAGNOSIS — Z952 Presence of prosthetic heart valve: Secondary | ICD-10-CM | POA: Diagnosis not present

## 2017-10-16 DIAGNOSIS — Z01810 Encounter for preprocedural cardiovascular examination: Secondary | ICD-10-CM | POA: Diagnosis not present

## 2017-10-16 DIAGNOSIS — Z7901 Long term (current) use of anticoagulants: Secondary | ICD-10-CM | POA: Diagnosis not present

## 2017-10-16 LAB — POCT INR: INR: 2.2

## 2017-10-20 NOTE — Progress Notes (Deleted)
Cardiology Office Note:    Date:  10/20/2017   ID:  Craig Dixon, DOB Jul 26, 1940, MRN [id number]  PCP:  Raelyn Number, MD  Cardiologist:  Shirlee More, MD    Referring MD: Raelyn Number, MD    ASSESSMENT:    1. Chronic systolic heart failure ([place])   2. 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 Chronic anticoagulation   4. ICD (implantable cardioverter-defibrillator) in place   5. Coronary artery disease involving native coronary artery of native heart with angina pectoris ([place])   6. H/O heart valve replacement with mechanical valve   7. Hyperlipidemia, unspecified hyperlipidemia type    PLAN:    In order of problems listed above:  1. ***   Next appointment: ***   Medication Adjustments/Labs and Tests Ordered: Current medicines are reviewed at length with the patient today.  Concerns regarding medicines are outlined above.  No orders of the defined types were placed in this encounter.  No orders of the defined types were placed in this encounter.   No chief complaint on file.   History of Present Illness:    Craig Dixon is a 77 y.o. male with a hx of CAD,systolic CHF,chronic atrial Fibrillation, Dyslipidemia, Peripheral Vascular Disease with CLI, S/P CABG, Valvular heart disease with a St Jude AVR and an ICD last seen 07/28/17.  ASSESSMENT:    1. Chronic systolic heart failure (HCC)   2. Persistent atrial fibrillation ([place])   3. LBBB (left bundle branch block)   4. 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 Coronary artery disease involving native coronary artery of native heart with angina pectoris ([place])   6. Chronic anticoagulation   7. H/O heart valve replacement with mechanical valve   8. ICD (implantable cardioverter-defibrillator) in place   9. Hyperlipidemia, unspecified hyperlipidemia type    PLAN:    1. Stable he has done remarkably well  considering the severity of his heart disease and comorbidities.  He will be transition from ACE inhibitor to [facility] with a 5-day gap and will continue his loop diuretic and minimum dose of beta-blocker that he tolerates hemodynamically.  He is not on MRA because of CKD and previous hyperkalemia.  Labs done last week requested from his PCP including BMP 2. Stable rate controlled continue beta-blocker 3. Stable 4. Stable blood pressure continue vasodilator beta-blocker diuretic and access recent labs done with his PCP for CKD 5. Stable continue medical treatment he is on a beta-blocker and high intensity statin 6. Stable continue warfarin goal INR is 3 7. Stable Saint Jude aortic valve 8. Stable function, referred to EP care 9. Stable continue statin await recent labs for liver function toxicity LDL efficacy with high intensity statin and CABG     Compliance with diet, lifestyle and medications: *** Past Medical History:  Diagnosis Date  . Arthritis   . Atherosclerosis of native arteries of extremity with rest pain ([place]) 08/17/2015  . Cachexia ([place]) 03/04/2017  . Carotid stenosis 01/18/2015   Overview:  Severe asymptomatic L ICA stenosis  . Cataract   . Chronic anticoagulation 02/20/2017  . Chronic kidney disease   . Chronic systolic heart failure ([place]) 01/18/2015  . CKD (chronic kidney disease), stage III ([place]) 01/18/2015  . COPD (chronic obstructive pulmonary disease) ([place])   . Coronary artery disease involving native coronary artery of native heart with angina pectoris ([place]) 01/18/2015  .  Diabetes mellitus without complication ([place])   . Diabetic polyneuropathy associated with type 2 diabetes mellitus ([place]) 03/04/2017  . Encounter for therapeutic drug monitoring 02/20/2017  . GERD (gastroesophageal reflux disease)   . H/O heart valve replacement with mechanical valve 01/18/2015   Overview:  St Jude AVR  . History of leg amputation ([place]) 02/18/2016  . Hyperlipidemia 03/04/2017  . Hypertension   .  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 Hypokalemia 03/04/2017  . Hypothyroidism (acquired) 03/04/2017  . ICD (implantable cardioverter-defibrillator) in place 01/25/2016  . LBBB (left bundle branch block) 03/04/2017  . Onychomycosis due to dermatophyte 02/18/2016  . PAD (peripheral artery disease) ([place]) 07/12/2015   Overview:  With R AKA and LLE CLI,: Based on his angiographic findings, this patient needs: palliative mgmt vs L AKA. I discussed in depth with the patient the nature of atherosclerosis, and emphasized the importance of maximal medical management including strict control of blood pressure, blood glucose, and lipid levels, obtaining regular exercise, and cessation of smoking. The patient is aware that without maximal medical management the underlying atherosclerotic disease process will progress, limiting the benefit of any interventions.  . Paroxysmal atrial fibrillation ([place]) 12/17/2016  . Peripheral arterial occlusive disease ([place]) 02/18/2016  . Reflux esophagitis 03/04/2017  . Thyroid disease     Past Surgical History:  Procedure Laterality Date  . BELOW KNEE LEG AMPUTATION Right 2001  . CARDIAC VALVE REPLACEMENT    . CORONARY ARTERY BYPASS GRAFT    . EP IMPLANTABLE DEVICE    . leg ampuation  2001   right leg  . pace maker  Aug. 2014  . PACEMAKER INSERTION    . PERIPHERAL VASCULAR CATHETERIZATION N/A 08/17/2015   Procedure: Abdominal Aortogram w/Lower Extremity;  Surgeon: Conrad Burwell, MD;  Location: [facility];  Service: Cardiovascular;  Laterality: N/A;    Current Medications: No outpatient medications have been marked as taking for the 10/21/17 encounter (Appointment) with Richardo Priest, MD.     Allergies:   Clonidine; Gabapentin; Hydralazine; Nsaids; Talwin [pentazocine]; Tape; and Tolmetin   Social History   Socioeconomic History  . Marital status: Single    Spouse name: Not on file  . Number of  children: Not on file  . Years of education: Not on file  . Highest education level: Not on file  Occupational History  . Not on file  Social Needs  . Financial resource strain: Not on file  . Food insecurity:    Worry: Not on file    Inability: Not on file  . Transportation needs:    Medical: Not on file    Non-medical: Not on file  Tobacco Use  . Smoking status: Current Every Day Smoker    Packs/day: 1.00  . Smokeless tobacco: Never Used  Substance and Sexual Activity  . Alcohol use: No    Alcohol/week: 0.0 oz  . Drug use: No  . Sexual activity: Not on file  Lifestyle  . Physical activity:    Days per week: Not on file    Minutes per session: Not on file  . Stress: Not on file  Relationships  . Social connections:    Talks on phone: Not on file    Gets together: Not on file    Attends religious service: Not on file    Active member of club or organization: Not on file    Attends meetings of clubs or organizations: Not on  file    Relationship status: Not on file  Other Topics Concern  . Not on file  Social History Narrative  . Not on file     Family History: The patient's ***family history includes Heart attack in his father; Heart disease in his father, sister, and son. ROS:   Please see the history of present illness.    All other systems reviewed and are negative.  EKGs/Labs/Other Studies Reviewed:    The following studies were reviewed today:  EKG:  EKG ordered today.  The ekg ordered today demonstrates ***  Recent Labs: 03/06/2017: ALT 10; BNP 2,037.3; BUN 37; Creatinine, Ser 1.97; Potassium 4.7; Sodium 140  Recent Lipid Panel No results found for: CHOL, TRIG, HDL, CHOLHDL, VLDL, LDLCALC, LDLDIRECT  Physical Exam:    VS:  There were no vitals taken for this visit.    Wt Readings from Last 3 Encounters:  10/13/17 125 lb (56.7 kg)  04/04/17 125 lb (56.7 kg)  03/06/17 120 lb (54.4 kg)     GEN: *** Well nourished, well developed in no acute  distress HEENT: Normal NECK: No JVD; No carotid bruits LYMPHATICS: No lymphadenopathy CARDIAC: ***RRR, no murmurs, rubs, gallops RESPIRATORY:  Clear to auscultation without rales, wheezing or rhonchi  ABDOMEN: Soft, non-tender, non-distended MUSCULOSKELETAL:  No edema; No deformity  SKIN: Warm and dry NEUROLOGIC:  Alert and oriented x 3 PSYCHIATRIC:  Normal affect    Signed, Shirlee More, MD  10/20/2017 12:07 PM    [place]

## 2017-10-21 ENCOUNTER — Ambulatory Visit: Payer: Medicare Other | Admitting: Cardiology

## 2017-10-23 ENCOUNTER — Ambulatory Visit (INDEPENDENT_AMBULATORY_CARE_PROVIDER_SITE_OTHER): Payer: Medicare Other | Admitting: Cardiology

## 2017-10-23 ENCOUNTER — Encounter: Payer: Self-pay | Admitting: Cardiology

## 2017-10-23 VITALS — BP 100/68 | HR 57

## 2017-10-23 DIAGNOSIS — Z7901 Long term (current) use of anticoagulants: Secondary | ICD-10-CM | POA: Diagnosis not present

## 2017-10-23 DIAGNOSIS — Z95810 Presence of automatic (implantable) cardiac defibrillator: Secondary | ICD-10-CM

## 2017-10-23 DIAGNOSIS — I130 Hypertensive heart and chronic kidney disease with heart failure and stage 1 through stage 4 chronic kidney disease, or unspecified chronic kidney disease: Secondary | ICD-10-CM | POA: Diagnosis not present

## 2017-10-23 DIAGNOSIS — E785 Hyperlipidemia, unspecified: Secondary | ICD-10-CM

## 2017-10-23 DIAGNOSIS — I482 Chronic atrial fibrillation, unspecified: Secondary | ICD-10-CM

## 2017-10-23 DIAGNOSIS — Z952 Presence of prosthetic heart valve: Secondary | ICD-10-CM

## 2017-10-23 DIAGNOSIS — I5022 Chronic systolic (congestive) heart failure: Secondary | ICD-10-CM

## 2017-10-23 DIAGNOSIS — Z01810 Encounter for preprocedural cardiovascular examination: Secondary | ICD-10-CM

## 2017-10-23 LAB — POCT INR: INR: 1.8

## 2017-10-23 NOTE — Progress Notes (Signed)
Cardiology Office Note:    Date:  10/23/2017   ID:  SAQIB CAZAREZ, DOB [birth year], MRN [id number]  PCP:  Raelyn Number, MD  Cardiologist:  Shirlee More, MD    Referring MD: Raelyn Number, MD    ASSESSMENT:    1. Chronic systolic heart failure ([place])   2. Chronic atrial fibrillation (HCC)   3. H/O heart valve replacement with mechanical valve   4. Chronic anticoagulation   5. ICD (implantable cardioverter-defibrillator) in place   6. 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 Hyperlipidemia, unspecified hyperlipidemia type    PLAN:    In order of problems listed above:  1. Stable continue current diuretic sodium restriction home management and guideline directed therapy with beta-blocker.  Unfortunately he was intolerant of ARNI with hypotension. 2. Stable rate controlled continue beta-blocker and anticoagulation 3. Stable function continue anticoagulation goal INR is 2.5-3 4. Continue warfarin managed in our anticoagulation program 5. Stable recently evaluated in device clinic is up to wall was abnormal but clinically has no evidence of decompensated heart failure and I think it is a poor marker for treatment decisions 6. Stable blood pressure renal function obtain recent labs performed at nephrology not in epic 7. Stable continue current treatment with CAD   Next appointment: 3 month   Medication Adjustments/Labs and Tests Ordered: Current medicines are reviewed at length with the patient today.  Concerns regarding medicines are outlined above.  No orders of the defined types were placed in this encounter.  No orders of the defined types were placed in this encounter.   Chief Complaint  Patient presents with  . Follow-up    3 month flup appt   . Congestive Heart Failure  . Atrial Fibrillation  . Cardiac Valve Problem  . Anticoagulation  . Hypertension  . Hyperlipidemia    History of Present Illness:     Craig Dixon is a 77 y.o. male with a hx of CAD,systolic CHF,chronic atrial Fibrillation, Dyslipidemia, Peripheral Vascular Disease with CLI, S/P CABG, Valvular heart disease with a St Jude AVR and an ICD  last seen 07/28/17.  ASSESSMENT:    07/28/17   1. Chronic systolic heart failure (HCC)   2. Persistent atrial fibrillation ([place])   3. LBBB (left bundle branch block)   4. 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 Coronary artery disease involving native coronary artery of native heart with angina pectoris ([place])   6. Chronic anticoagulation   7. H/O heart valve replacement with mechanical valve   8. ICD (implantable cardioverter-defibrillator) in place   9. Hyperlipidemia, unspecified hyperlipidemia type    PLAN:    1.    Stable he has done remarkably well considering the severity of his heart disease and comorbidities.  He will be transition from ACE inhibitor to [facility] with a 5-day gap and will continue his loop diuretic and minimum dose of beta-blocker that he tolerates hemodynamically.  He is not on MRA because of CKD and previous hyperkalemia.  Labs done last week requested from his PCP including BMP 8. Stable rate controlled continue beta-blocker 9. Stable 10. Stable blood pressure continue vasodilator beta-blocker diuretic and access recent labs done with his PCP for CKD 11. Stable continue medical treatment he is on a beta-blocker and high intensity statin 12. Stable continue warfarin goal INR is 3 13. Stable Saint Jude aortic valve 14. Stable function, referred  to EP care 15. Stable continue statin await recent labs for liver function toxicity LDL efficacy with high intensity statin and CABG    Compliance with diet, lifestyle and medications: yes  As always he is accompanied by his daughter who takes meticulous care his heart failure is compensated off Metformin his appetite is improved and strength and endurance  are much better.  He has had no chest pain edema orthopnea palpitations syncope or bleeding complication of his anticoagulant.  He continues to follow with urology for bladder cancer.  Recently seen in device clinic notes reviewed although noninvasively he appears to have increased lung water clinically his heart failure is compensated. Past Medical History:  Diagnosis Date  . Arthritis   . Atherosclerosis of native arteries of extremity with rest pain ([place]) 08/17/2015  . Cachexia ([place]) 03/04/2017  . Carotid stenosis 01/18/2015   Overview:  Severe asymptomatic L ICA stenosis  . Cataract   . Chronic anticoagulation 02/20/2017  . Chronic kidney disease   . Chronic systolic heart failure ([place]) 01/18/2015  . CKD (chronic kidney disease), stage III ([place]) 01/18/2015  . COPD (chronic obstructive pulmonary disease) ([place])   . Coronary artery disease involving native coronary artery of native heart with angina pectoris ([place]) 01/18/2015  . Diabetes mellitus without complication ([place])   . Diabetic polyneuropathy associated with type 2 diabetes mellitus ([place]) 03/04/2017  . Encounter for therapeutic drug monitoring 02/20/2017  . GERD (gastroesophageal reflux disease)   . H/O heart valve replacement with mechanical valve 01/18/2015   Overview:  St Jude AVR  . History of leg amputation ([place]) 02/18/2016  . Hyperlipidemia 03/04/2017  . Hypertension   . 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 Hypokalemia 03/04/2017  . Hypothyroidism (acquired) 03/04/2017  . ICD (implantable cardioverter-defibrillator) in place 01/25/2016  . LBBB (left bundle branch block) 03/04/2017  . Onychomycosis due to dermatophyte 02/18/2016  . PAD (peripheral artery disease) ([place]) 07/12/2015   Overview:  With R AKA and LLE CLI,: Based on his angiographic findings, this patient needs: palliative mgmt vs L AKA. I discussed in depth with the patient the nature of atherosclerosis, and emphasized the  importance of maximal medical management including strict control of blood pressure, blood glucose, and lipid levels, obtaining regular exercise, and cessation of smoking. The patient is aware that without maximal medical management the underlying atherosclerotic disease process will progress, limiting the benefit of any interventions.  . Paroxysmal atrial fibrillation ([place]) 12/17/2016  . Peripheral arterial occlusive disease ([place]) 02/18/2016  . Reflux esophagitis 03/04/2017  . Thyroid disease     Past Surgical History:  Procedure Laterality Date  . BELOW KNEE LEG AMPUTATION Right 2001  . CARDIAC VALVE REPLACEMENT    . CORONARY ARTERY BYPASS GRAFT    . EP IMPLANTABLE DEVICE    . leg ampuation  2001   right leg  . pace maker  Aug. 2014  . PACEMAKER INSERTION    . PERIPHERAL VASCULAR CATHETERIZATION N/A 08/17/2015   Procedure: Abdominal Aortogram w/Lower Extremity;  Surgeon: Conrad De Witt, MD;  Location: [facility];  Service: Cardiovascular;  Laterality: N/A;    Current Medications: Current Meds  Medication Sig  . amitriptyline (ELAVIL) 50 MG tablet 50 mg every evening  . atorvastatin (LIPITOR) 40 MG tablet Take 40 mg by mouth daily at 6 PM.   . COUMADIN 1 MG tablet Take 1.5 mg by mouth as directed.   . docusate sodium (COLACE) 100  MG capsule Take 100 mg by mouth 2 (two) times daily.  . furosemide (LASIX) 40 MG tablet Take 40 mg by mouth daily. Reported on 08/09/2015  . isosorbide mononitrate (IMDUR) 60 MG 24 hr tablet Take 60 mg by mouth daily. Reported on 08/09/2015  . levothyroxine (SYNTHROID, LEVOTHROID) 88 MCG tablet Take 88 mcg by mouth daily before breakfast.  . metoprolol succinate (TOPROL-XL) 25 MG 24 hr tablet Take 1 tablet (25 mg total) by mouth 4 (four) times a week. 25 mg on Tues, Thurs, Sat, and Sun  . mupirocin ointment (BACTROBAN) 2 % Apply 1 application topically daily as needed (for wound care).   . nitroGLYCERIN (NITROSTAT) 0.4 MG SL tablet DISSOLVE 1 TABLET UNDER THE  TONGUE EVERY 5 MINUTES AS NEEDED FOR CHEST PAIN. (MAXIMUM OF 3 DOSES)  . OVER THE COUNTER MEDICATION Apply 1 application topically daily.  . polyethylene glycol powder (GLYCOLAX/MIRALAX) powder Take 17 g by mouth at bedtime as needed for mild constipation.   . Wound Dressings (SOLOSITE WOUND GEL) GEL Apply 1 application topically daily as needed (for would care).     Allergies:   Clonidine; Gabapentin; Hydralazine; Nsaids; Talwin [pentazocine]; Tape; and Tolmetin   Social History   Socioeconomic History  . Marital status: Single    Spouse name: Not on file  . Number of children: Not on file  . Years of education: Not on file  . Highest education level: Not on file  Occupational History  . Not on file  Social Needs  . Financial resource strain: Not on file  . Food insecurity:    Worry: Not on file    Inability: Not on file  . Transportation needs:    Medical: Not on file    Non-medical: Not on file  Tobacco Use  . Smoking status: Current Every Day Smoker    Packs/day: 1.00  . Smokeless tobacco: Never Used  Substance and Sexual Activity  . Alcohol use: No    Alcohol/week: 0.0 oz  . Drug use: No  . Sexual activity: Not on file  Lifestyle  . Physical activity:    Days per week: Not on file    Minutes per session: Not on file  . Stress: Not on file  Relationships  . Social connections:    Talks on phone: Not on file    Gets together: Not on file    Attends religious service: Not on file    Active member of club or organization: Not on file    Attends meetings of clubs or organizations: Not on file    Relationship status: Not on file  Other Topics Concern  . Not on file  Social History Narrative  . Not on file     Family History: The patient's family history includes Heart attack in his father; Heart disease in his father, sister, and son. ROS:   Please see the history of present illness.    All other systems reviewed and are negative.  EKGs/Labs/Other Studies  Reviewed:    The following studies were reviewed today: Recent device check reviewed prior to visit  Recent Labs: 03/06/2017: ALT 10; BNP 2,037.3; BUN 37; Creatinine, Ser 1.97; Potassium 4.7; Sodium 140  Recent Lipid Panel No results found for: CHOL, TRIG, HDL, CHOLHDL, VLDL, LDLCALC, LDLDIRECT  Physical Exam:    VS:  BP 100/68 (BP Location: Right Arm, Patient Position: Sitting, Cuff Size: Small)   Pulse (!) 57   SpO2 93%     Wt Readings from Last 3  Encounters:  10/13/17 125 lb (56.7 kg)  04/04/17 125 lb (56.7 kg)  03/06/17 120 lb (54.4 kg)     GEN:  Well nourished, well developed in no acute distress HEENT: Normal NECK: No JVD; No carotid bruits LYMPHATICS: No lymphadenopathy CARDIAC: sharp CS no AR IrrIrr no s3  RESPIRATORY:  Clear to auscultation without rales, wheezing or rhonchi  ABDOMEN: Soft, non-tender, non-distended MUSCULOSKELETAL:  No edema; No deformity  SKIN: Warm and dry NEUROLOGIC:  Alert and oriented x 3 PSYCHIATRIC:  Normal affect    Signed, Shirlee More, MD  10/23/2017 1:57 PM    [facility]

## 2017-10-23 NOTE — Patient Instructions (Signed)
Medication Instructions:  Your physician recommends that you continue on your current medications as directed. Please refer to the Current Medication list given to you today.  Labwork: None  Testing/Procedures: None  Follow-Up: Your physician wants you to follow-up in: 4 months. You will receive a reminder letter in the mail two months in advance. If you don't receive a letter, please call our office to schedule the follow-up appointment.  Any Other Special Instructions Will Be Listed Below (If Applicable).     If you need a refill on your cardiac medications before your next appointment, please call your pharmacy.   

## 2017-10-30 ENCOUNTER — Ambulatory Visit (INDEPENDENT_AMBULATORY_CARE_PROVIDER_SITE_OTHER): Payer: Medicare Other | Admitting: Cardiovascular Disease

## 2017-10-30 DIAGNOSIS — Z01810 Encounter for preprocedural cardiovascular examination: Secondary | ICD-10-CM

## 2017-10-30 DIAGNOSIS — Z952 Presence of prosthetic heart valve: Secondary | ICD-10-CM | POA: Diagnosis not present

## 2017-10-30 DIAGNOSIS — Z7901 Long term (current) use of anticoagulants: Secondary | ICD-10-CM | POA: Diagnosis not present

## 2017-10-30 LAB — POCT INR: INR: 2.5

## 2017-11-06 ENCOUNTER — Ambulatory Visit (INDEPENDENT_AMBULATORY_CARE_PROVIDER_SITE_OTHER): Payer: Medicare Other | Admitting: Cardiovascular Disease

## 2017-11-06 DIAGNOSIS — Z952 Presence of prosthetic heart valve: Secondary | ICD-10-CM | POA: Diagnosis not present

## 2017-11-06 DIAGNOSIS — Z01810 Encounter for preprocedural cardiovascular examination: Secondary | ICD-10-CM

## 2017-11-06 DIAGNOSIS — Z7901 Long term (current) use of anticoagulants: Secondary | ICD-10-CM | POA: Diagnosis not present

## 2017-11-06 LAB — POCT INR: INR: 3.1

## 2017-11-13 ENCOUNTER — Ambulatory Visit (INDEPENDENT_AMBULATORY_CARE_PROVIDER_SITE_OTHER): Payer: Medicare Other | Admitting: Cardiovascular Disease

## 2017-11-13 DIAGNOSIS — Z7901 Long term (current) use of anticoagulants: Secondary | ICD-10-CM | POA: Diagnosis not present

## 2017-11-13 DIAGNOSIS — I482 Chronic atrial fibrillation, unspecified: Secondary | ICD-10-CM

## 2017-11-13 DIAGNOSIS — Z01810 Encounter for preprocedural cardiovascular examination: Secondary | ICD-10-CM

## 2017-11-13 DIAGNOSIS — Z952 Presence of prosthetic heart valve: Secondary | ICD-10-CM

## 2017-11-13 DIAGNOSIS — Z5181 Encounter for therapeutic drug level monitoring: Secondary | ICD-10-CM | POA: Diagnosis not present

## 2017-11-13 LAB — POCT INR: INR: 2.9

## 2017-11-13 NOTE — Patient Instructions (Signed)
Description   Spoke with Jackie(daughter) continue taking 2mg  daily except 1.5mg  on Tuesdays, Thursdays and Sundays.  Recheck INR in 1 week.

## 2017-11-19 DIAGNOSIS — Z8551 Personal history of malignant neoplasm of bladder: Secondary | ICD-10-CM | POA: Insufficient documentation

## 2017-11-20 ENCOUNTER — Ambulatory Visit (INDEPENDENT_AMBULATORY_CARE_PROVIDER_SITE_OTHER): Payer: Medicare Other | Admitting: Cardiology

## 2017-11-20 DIAGNOSIS — Z5181 Encounter for therapeutic drug level monitoring: Secondary | ICD-10-CM

## 2017-11-20 DIAGNOSIS — Z952 Presence of prosthetic heart valve: Secondary | ICD-10-CM

## 2017-11-20 DIAGNOSIS — I482 Chronic atrial fibrillation, unspecified: Secondary | ICD-10-CM

## 2017-11-20 DIAGNOSIS — Z01810 Encounter for preprocedural cardiovascular examination: Secondary | ICD-10-CM

## 2017-11-20 DIAGNOSIS — Z7901 Long term (current) use of anticoagulants: Secondary | ICD-10-CM | POA: Diagnosis not present

## 2017-11-20 LAB — POCT INR: INR: 2.8

## 2017-11-20 NOTE — Patient Instructions (Signed)
Description   Spoke with Jackie(daughter) continue taking 2mg  daily except 1.5mg  on Tuesdays, Thursdays and Sundays.  Recheck INR in 1 week.

## 2017-11-28 ENCOUNTER — Ambulatory Visit (INDEPENDENT_AMBULATORY_CARE_PROVIDER_SITE_OTHER): Payer: Medicare Other | Admitting: Cardiology

## 2017-11-28 DIAGNOSIS — Z7901 Long term (current) use of anticoagulants: Secondary | ICD-10-CM

## 2017-11-28 DIAGNOSIS — Z952 Presence of prosthetic heart valve: Secondary | ICD-10-CM

## 2017-11-28 DIAGNOSIS — Z01810 Encounter for preprocedural cardiovascular examination: Secondary | ICD-10-CM

## 2017-11-28 LAB — POCT INR: INR: 2.1

## 2017-12-04 ENCOUNTER — Ambulatory Visit (INDEPENDENT_AMBULATORY_CARE_PROVIDER_SITE_OTHER): Payer: Medicare Other | Admitting: Interventional Cardiology

## 2017-12-04 DIAGNOSIS — Z5181 Encounter for therapeutic drug level monitoring: Secondary | ICD-10-CM | POA: Diagnosis not present

## 2017-12-04 DIAGNOSIS — Z7901 Long term (current) use of anticoagulants: Secondary | ICD-10-CM | POA: Diagnosis not present

## 2017-12-04 DIAGNOSIS — I482 Chronic atrial fibrillation, unspecified: Secondary | ICD-10-CM

## 2017-12-04 DIAGNOSIS — Z952 Presence of prosthetic heart valve: Secondary | ICD-10-CM | POA: Diagnosis not present

## 2017-12-04 DIAGNOSIS — Z01810 Encounter for preprocedural cardiovascular examination: Secondary | ICD-10-CM

## 2017-12-04 LAB — POCT INR: INR: 3.1 — AB (ref 2.0–3.0)

## 2017-12-04 NOTE — Patient Instructions (Signed)
Description   Spoke with Jackie(daughter) and instructed to have pt  continue taking 2mg daily except 1.5mg on Tuesdays, Thursdays and Sundays.  Recheck INR in 1 week.     

## 2017-12-11 ENCOUNTER — Ambulatory Visit (INDEPENDENT_AMBULATORY_CARE_PROVIDER_SITE_OTHER): Payer: Medicare Other | Admitting: Cardiovascular Disease

## 2017-12-11 DIAGNOSIS — I482 Chronic atrial fibrillation, unspecified: Secondary | ICD-10-CM

## 2017-12-11 DIAGNOSIS — Z952 Presence of prosthetic heart valve: Secondary | ICD-10-CM

## 2017-12-11 DIAGNOSIS — Z01810 Encounter for preprocedural cardiovascular examination: Secondary | ICD-10-CM

## 2017-12-11 DIAGNOSIS — Z5181 Encounter for therapeutic drug level monitoring: Secondary | ICD-10-CM

## 2017-12-11 DIAGNOSIS — Z7901 Long term (current) use of anticoagulants: Secondary | ICD-10-CM

## 2017-12-11 LAB — POCT INR: INR: 2.3 (ref 2.0–3.0)

## 2017-12-11 NOTE — Patient Instructions (Signed)
Description   Spoke with Jackie(daughter) and instructed to have pt  take coumadin 2mg  today May 30th  then continue taking 2mg  daily except 1.5mg  on Tuesdays, Thursdays and Sundays.  Recheck INR in 1 week.

## 2017-12-12 ENCOUNTER — Other Ambulatory Visit: Payer: Self-pay

## 2017-12-12 MED ORDER — METOPROLOL SUCCINATE ER 25 MG PO TB24: 25.0000 mg | ORAL_TABLET | ORAL | 3 refills | Status: AC

## 2017-12-15 ENCOUNTER — Telehealth: Payer: Self-pay | Admitting: Cardiology

## 2017-12-15 NOTE — Telephone Encounter (Signed)
Patient's daughter, Craig Dixon, called regarding the fact that the patient's creatinine was elevated at his last visit with Dr. Myles Rosenthal. Dr. Myles Rosenthal advised to reduce furosemide dose to 40 mg every other day until next follow-up visit with Dr. Bettina Gavia which is a little over 2 months away. Patient was take furosemide 40 mg daily. Daughter concerned to wait that long before discussing with Dr. Bettina Gavia. Daughter has requested lab results be faxed to this office. Will review with Dr. Bettina Gavia when results received.  Please advise.

## 2017-12-15 NOTE — Telephone Encounter (Signed)
Wants to go over some things he found out at the doctor today

## 2017-12-16 NOTE — Telephone Encounter (Signed)
Advised to keep furosemide dose reduced to every other day per Dr. Bettina Gavia. Craig Dixon verbalized understanding. No further questions.

## 2017-12-18 ENCOUNTER — Ambulatory Visit (INDEPENDENT_AMBULATORY_CARE_PROVIDER_SITE_OTHER): Payer: Medicare Other | Admitting: Internal Medicine

## 2017-12-18 DIAGNOSIS — Z952 Presence of prosthetic heart valve: Secondary | ICD-10-CM | POA: Diagnosis not present

## 2017-12-18 DIAGNOSIS — I482 Chronic atrial fibrillation, unspecified: Secondary | ICD-10-CM

## 2017-12-18 DIAGNOSIS — Z7901 Long term (current) use of anticoagulants: Secondary | ICD-10-CM | POA: Diagnosis not present

## 2017-12-18 DIAGNOSIS — Z5181 Encounter for therapeutic drug level monitoring: Secondary | ICD-10-CM

## 2017-12-18 DIAGNOSIS — Z01810 Encounter for preprocedural cardiovascular examination: Secondary | ICD-10-CM

## 2017-12-18 LAB — POCT INR: INR: 3.4 — AB (ref 2.0–3.0)

## 2017-12-18 NOTE — Patient Instructions (Signed)
Description   Spoke with Jackie(daughter) and instructed to have pt  continue taking 2mg daily except 1.5mg on Tuesdays, Thursdays and Sundays.  Recheck INR in 1 week.     

## 2017-12-26 ENCOUNTER — Ambulatory Visit (INDEPENDENT_AMBULATORY_CARE_PROVIDER_SITE_OTHER): Payer: Medicare Other | Admitting: Pharmacist

## 2017-12-26 DIAGNOSIS — Z01810 Encounter for preprocedural cardiovascular examination: Secondary | ICD-10-CM | POA: Diagnosis not present

## 2017-12-26 DIAGNOSIS — Z7901 Long term (current) use of anticoagulants: Secondary | ICD-10-CM

## 2017-12-26 DIAGNOSIS — Z952 Presence of prosthetic heart valve: Secondary | ICD-10-CM | POA: Diagnosis not present

## 2017-12-26 LAB — POCT INR: INR: 4 — AB (ref 2.0–3.0)

## 2018-01-01 ENCOUNTER — Ambulatory Visit (INDEPENDENT_AMBULATORY_CARE_PROVIDER_SITE_OTHER): Payer: Medicare Other | Admitting: Internal Medicine

## 2018-01-01 DIAGNOSIS — Z01810 Encounter for preprocedural cardiovascular examination: Secondary | ICD-10-CM

## 2018-01-01 DIAGNOSIS — I482 Chronic atrial fibrillation, unspecified: Secondary | ICD-10-CM

## 2018-01-01 DIAGNOSIS — Z7901 Long term (current) use of anticoagulants: Secondary | ICD-10-CM

## 2018-01-01 DIAGNOSIS — Z5181 Encounter for therapeutic drug level monitoring: Secondary | ICD-10-CM

## 2018-01-01 DIAGNOSIS — Z952 Presence of prosthetic heart valve: Secondary | ICD-10-CM | POA: Diagnosis not present

## 2018-01-01 LAB — POCT INR: INR: 2.9 (ref 2.0–3.0)

## 2018-01-01 NOTE — Patient Instructions (Signed)
Description   Spoke with Jackie(daughter) and instructed to have pt  continue taking 2mg  daily except 1.5mg  on Tuesdays, Thursdays and Sundays.  Recheck INR in 1 week.

## 2018-01-08 ENCOUNTER — Ambulatory Visit (INDEPENDENT_AMBULATORY_CARE_PROVIDER_SITE_OTHER): Payer: Medicare Other | Admitting: Interventional Cardiology

## 2018-01-08 DIAGNOSIS — Z952 Presence of prosthetic heart valve: Secondary | ICD-10-CM | POA: Diagnosis not present

## 2018-01-08 DIAGNOSIS — Z7901 Long term (current) use of anticoagulants: Secondary | ICD-10-CM | POA: Diagnosis not present

## 2018-01-08 DIAGNOSIS — Z01810 Encounter for preprocedural cardiovascular examination: Secondary | ICD-10-CM

## 2018-01-08 LAB — POCT INR: INR: 3.6 — AB (ref 2.0–3.0)

## 2018-01-12 ENCOUNTER — Ambulatory Visit (INDEPENDENT_AMBULATORY_CARE_PROVIDER_SITE_OTHER): Payer: Medicare Other | Admitting: *Deleted

## 2018-01-12 DIAGNOSIS — I482 Chronic atrial fibrillation, unspecified: Secondary | ICD-10-CM

## 2018-01-12 DIAGNOSIS — I5022 Chronic systolic (congestive) heart failure: Secondary | ICD-10-CM

## 2018-01-12 NOTE — Progress Notes (Signed)
Remote ICD transmission.   

## 2018-01-15 LAB — POCT INR: INR: 2.1 (ref 2.0–3.0)

## 2018-01-16 ENCOUNTER — Ambulatory Visit (INDEPENDENT_AMBULATORY_CARE_PROVIDER_SITE_OTHER): Payer: Medicare Other | Admitting: Cardiology

## 2018-01-16 DIAGNOSIS — Z952 Presence of prosthetic heart valve: Secondary | ICD-10-CM | POA: Diagnosis not present

## 2018-01-16 DIAGNOSIS — Z5181 Encounter for therapeutic drug level monitoring: Secondary | ICD-10-CM | POA: Diagnosis not present

## 2018-01-16 DIAGNOSIS — Z01810 Encounter for preprocedural cardiovascular examination: Secondary | ICD-10-CM

## 2018-01-16 DIAGNOSIS — Z7901 Long term (current) use of anticoagulants: Secondary | ICD-10-CM

## 2018-01-16 NOTE — Patient Instructions (Signed)
Description   Spoke with Kennyth Lose (daughter) and instructed to have pt take 2.5mg  today, then continue taking 2mg  daily except 1.5mg  on Tuesdays, Thursdays and Sundays.  Recheck INR in 1 week.

## 2018-01-23 ENCOUNTER — Ambulatory Visit (INDEPENDENT_AMBULATORY_CARE_PROVIDER_SITE_OTHER): Payer: Medicare Other | Admitting: Cardiology

## 2018-01-23 DIAGNOSIS — Z7901 Long term (current) use of anticoagulants: Secondary | ICD-10-CM | POA: Diagnosis not present

## 2018-01-23 DIAGNOSIS — Z5181 Encounter for therapeutic drug level monitoring: Secondary | ICD-10-CM

## 2018-01-23 DIAGNOSIS — Z952 Presence of prosthetic heart valve: Secondary | ICD-10-CM | POA: Diagnosis not present

## 2018-01-23 DIAGNOSIS — Z01810 Encounter for preprocedural cardiovascular examination: Secondary | ICD-10-CM

## 2018-01-23 LAB — POCT INR: INR: 2.9 (ref 2.0–3.0)

## 2018-01-26 NOTE — Progress Notes (Signed)
Cardiology Office Note:    Date:  01/27/2018   ID:  Craig Dixon, DOB [birth year], MRN [id number]  PCP:  Raelyn Number, MD  Cardiologist:  Shirlee More, MD    Referring MD: Raelyn Number, MD   Please do a BNP and lipids with your labs   ASSESSMENT:    1. Chronic systolic heart failure ([place])   2. Chronic atrial fibrillation (HCC)   3. Coronary artery disease involving native coronary artery of native heart with angina pectoris ([place])   4. H/O heart valve replacement with mechanical valve   5. Chronic anticoagulation   6. CKD (chronic kidney disease), stage III ([place])   7. 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 Hyperlipidemia, unspecified hyperlipidemia type   9. Peripheral arterial occlusive disease ([place])    PLAN    In order of problems listed above:  1. Source failure is compensators New York Heart Association class II no fluid overload and continue his carefully try to titrate his low-dose beta-blocker ACE inhibitor and loop diuretic. 2. Stable rate controlled his AF burden is 16% 3. Stable continue medical treatment 4. Stable continue warfarin goal INR is 3-3.5 5. Stable all labs drawn next week with his PCP and follows with nephrology 6. See above stable 7. He will have labs next week liver function for safety lipid profile for efficacy copy to me continue statin 8. I discussed with vascular surgery tomorrow the potential referral for stem cell therapy   Next appointment: 3 months   Medication Adjustments/Labs and Tests Ordered: Current medicines are reviewed at length with the patient today.  Concerns regarding medicines are outlined above.  No orders of the defined types were placed in this encounter.  No orders of the defined types were placed in this encounter.   Chief Complaint  Patient presents with  . Follow-up  . Coronary Artery Disease  . Congestive Heart Failure  . Atrial Fibrillation  .  Hyperlipidemia  . PAD    History of Present Illness:    Craig Dixon is a 77 y.o. male with a hx of CAD,systolic CHF,chronicatrial Fibrillation, Dyslipidemia, Peripheral Vascular Disease with CLI, S/P CABG, Valvular heart disease with a St Jude AVR and an ICD last seen 10/23/17.he takes demadex 10 mg every other day. Compliance with diet, lifestyle and medications: Yes  Continues to be supervised managed by his daughter and is pleased with the quality of his life no chest pain dyspnea palpitation or syncope.  There is a study in [place] with chronic limb ischemia with stem cell and I will review with vascular surgery tomorrow if he is a candidate. Past Medical History:  Diagnosis Date  . Arthritis   . Atherosclerosis of native arteries of extremity with rest pain ([place]) 08/17/2015  . Cachexia ([place]) 03/04/2017  . Carotid stenosis 01/18/2015   Overview:  Severe asymptomatic L ICA stenosis  . Cataract   . Chronic anticoagulation 02/20/2017  . Chronic kidney disease   . Chronic systolic heart failure ([place]) 01/18/2015  . CKD (chronic kidney disease), stage III ([place]) 01/18/2015  . COPD (chronic obstructive pulmonary disease) ([place])   . Coronary artery disease involving native coronary artery of native heart with angina pectoris ([place]) 01/18/2015  . Diabetes mellitus without complication ([place])   . Diabetic polyneuropathy associated with type 2 diabetes mellitus ([place]) 03/04/2017  . Encounter for therapeutic drug monitoring 02/20/2017  . GERD (gastroesophageal reflux disease)   . H/O  heart valve replacement with mechanical valve 01/18/2015   Overview:  St Jude AVR  . History of leg amputation ([place]) 02/18/2016  . Hyperlipidemia 03/04/2017  . Hypertension   . 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 Hypokalemia 03/04/2017  . Hypothyroidism (acquired) 03/04/2017  . ICD (implantable cardioverter-defibrillator) in place 01/25/2016  . LBBB (left bundle  branch block) 03/04/2017  . Onychomycosis due to dermatophyte 02/18/2016  . PAD (peripheral artery disease) ([place]) 07/12/2015   Overview:  With R AKA and LLE CLI,: Based on his angiographic findings, this patient needs: palliative mgmt vs L AKA. I discussed in depth with the patient the nature of atherosclerosis, and emphasized the importance of maximal medical management including strict control of blood pressure, blood glucose, and lipid levels, obtaining regular exercise, and cessation of smoking. The patient is aware that without maximal medical management the underlying atherosclerotic disease process will progress, limiting the benefit of any interventions.  . Paroxysmal atrial fibrillation ([place]) 12/17/2016  . Peripheral arterial occlusive disease ([place]) 02/18/2016  . Reflux esophagitis 03/04/2017  . Thyroid disease     Past Surgical History:  Procedure Laterality Date  . BELOW KNEE LEG AMPUTATION Right 2001  . CARDIAC VALVE REPLACEMENT    . CORONARY ARTERY BYPASS GRAFT    . EP IMPLANTABLE DEVICE    . leg ampuation  2001   right leg  . pace maker  Aug. 2014  . PACEMAKER INSERTION    . PERIPHERAL VASCULAR CATHETERIZATION N/A 08/17/2015   Procedure: Abdominal Aortogram w/Lower Extremity;  Surgeon: Conrad , MD;  Location: [facility];  Service: Cardiovascular;  Laterality: N/A;    Current Medications: Current Meds  Medication Sig  . HYDROcodone-acetaminophen (NORCO) 10-325 MG tablet Take 1 tablet by mouth 3 (three) times daily.  Marland Kitchen lisinopril (PRINIVIL,ZESTRIL) 5 MG tablet Take 5 mg by mouth 3 (three) times a week. Takes on Mon, Wed, and Fri     Allergies:   Clonidine; Gabapentin; Hydralazine; Nsaids; Talwin [pentazocine]; Tape; and Tolmetin   Social History   Socioeconomic History  . Marital status: Single    Spouse name: Not on file  . Number of children: Not on file  . Years of education: Not on file  . Highest education level: Not on file  Occupational History  . Not  on file  Social Needs  . Financial resource strain: Not on file  . Food insecurity:    Worry: Not on file    Inability: Not on file  . Transportation needs:    Medical: Not on file    Non-medical: Not on file  Tobacco Use  . Smoking status: Current Every Day Smoker    Packs/day: 1.00  . Smokeless tobacco: Never Used  Substance and Sexual Activity  . Alcohol use: No    Alcohol/week: 0.0 oz  . Drug use: No  . Sexual activity: Not on file  Lifestyle  . Physical activity:    Days per week: Not on file    Minutes per session: Not on file  . Stress: Not on file  Relationships  . Social connections:    Talks on phone: Not on file    Gets together: Not on file    Attends religious service: Not on file    Active member of club or organization: Not on file    Attends meetings of clubs or organizations: Not on file    Relationship status: Not on file  Other Topics Concern  . Not on file  Social History Narrative  . Not on file     Family History: The patient's family history includes Heart attack in his father; Heart disease in his father, sister, and son. ROS:   Please see the history of present illness.    All other systems reviewed and are negative.  EKGs/Labs/Other Studies Reviewed:    The following studies were reviewed today  Device check 09/05/2017 normal function, AF 16% INR 2.9  Recent Labs: 03/06/2017: ALT 10; BNP 2,037.3; BUN 37; Creatinine, Ser 1.97; Potassium 4.7; Sodium 140  Recent Lipid Panel No results found for: CHOL, TRIG, HDL, CHOLHDL, VLDL, LDLCALC, LDLDIRECT  Physical Exam:    VS:  BP 102/66 (BP Location: Right Arm, Patient Position: Sitting, Cuff Size: Normal)   Pulse 66   SpO2 98%     Wt Readings from Last 3 Encounters:  10/13/17 125 lb (56.7 kg)  04/04/17 125 lb (56.7 kg)  03/06/17 120 lb (54.4 kg)     GEN: Very frail chronically ill debilitated  in no acute distress HEENT: Normal NECK: No JVD; No carotid bruits LYMPHATICS: No  lymphadenopathy CARDIAC: Irregular irregular variable first heart sound Sharp closing sound Saint Jude AVR no murmur  RESPIRATORY:  Clear to auscultation without rales, wheezing or rhonchi  ABDOMEN: Soft, non-tender, non-distended MUSCULOSKELETAL:  No edema; No deformity  SKIN: Warm and dry NEUROLOGIC:  Alert and oriented x 3 PSYCHIATRIC:  Normal affect    Signed, Shirlee More, MD  01/27/2018 4:22 PM    [facility]

## 2018-01-27 ENCOUNTER — Encounter: Payer: Self-pay | Admitting: Cardiology

## 2018-01-27 ENCOUNTER — Ambulatory Visit (INDEPENDENT_AMBULATORY_CARE_PROVIDER_SITE_OTHER): Payer: Medicare Other | Admitting: Cardiology

## 2018-01-27 VITALS — BP 102/66 | HR 66

## 2018-01-27 DIAGNOSIS — I25119 Atherosclerotic heart disease of native coronary artery with unspecified angina pectoris: Secondary | ICD-10-CM | POA: Diagnosis not present

## 2018-01-27 DIAGNOSIS — I5022 Chronic systolic (congestive) heart failure: Secondary | ICD-10-CM | POA: Diagnosis not present

## 2018-01-27 DIAGNOSIS — Z7901 Long term (current) use of anticoagulants: Secondary | ICD-10-CM | POA: Diagnosis not present

## 2018-01-27 DIAGNOSIS — I779 Disorder of arteries and arterioles, unspecified: Secondary | ICD-10-CM

## 2018-01-27 DIAGNOSIS — I130 Hypertensive heart and chronic kidney disease with heart failure and stage 1 through stage 4 chronic kidney disease, or unspecified chronic kidney disease: Secondary | ICD-10-CM

## 2018-01-27 DIAGNOSIS — Z952 Presence of prosthetic heart valve: Secondary | ICD-10-CM

## 2018-01-27 DIAGNOSIS — I482 Chronic atrial fibrillation, unspecified: Secondary | ICD-10-CM

## 2018-01-27 DIAGNOSIS — N183 Chronic kidney disease, stage 3 unspecified: Secondary | ICD-10-CM

## 2018-01-27 DIAGNOSIS — E785 Hyperlipidemia, unspecified: Secondary | ICD-10-CM

## 2018-01-27 NOTE — Patient Instructions (Signed)
Medication Instructions:  Your physician recommends that you continue on your current medications as directed. Please refer to the Current Medication list given to you today.  Labwork: None  Testing/Procedures: None  Follow-Up: Your physician recommends that you schedule a follow-up appointment in: 3 months  Any Other Special Instructions Will Be Listed Below (If Applicable).     If you need a refill on your cardiac medications before your next appointment, please call your pharmacy.   [facility]  Clerence Gubser A, RN, BSN  

## 2018-01-29 ENCOUNTER — Ambulatory Visit (INDEPENDENT_AMBULATORY_CARE_PROVIDER_SITE_OTHER): Payer: Medicare Other | Admitting: Pharmacist

## 2018-01-29 DIAGNOSIS — Z952 Presence of prosthetic heart valve: Secondary | ICD-10-CM | POA: Diagnosis not present

## 2018-01-29 DIAGNOSIS — Z01810 Encounter for preprocedural cardiovascular examination: Secondary | ICD-10-CM | POA: Diagnosis not present

## 2018-01-29 DIAGNOSIS — Z7901 Long term (current) use of anticoagulants: Secondary | ICD-10-CM

## 2018-01-29 LAB — POCT INR: INR: 3.5 — AB (ref 2.0–3.0)

## 2018-01-31 LAB — CUP PACEART REMOTE DEVICE CHECK
Brady Statistic AP VS Percent: 61 %
Brady Statistic AS VP Percent: 1.7 %
Brady Statistic AS VS Percent: 22 %
Brady Statistic RA Percent Paced: 66 %
Date Time Interrogation Session: 20190630060016
HIGH POWER IMPEDANCE MEASURED VALUE: 75 Ohm
HighPow Impedance: 75 Ohm
Implantable Lead Implant Date: 20140414
Implantable Lead Implant Date: 20140414
Implantable Lead Location: 753859
Implantable Lead Location: 753860
Lead Channel Impedance Value: 390 Ohm
Lead Channel Pacing Threshold Amplitude: 0.5 V
Lead Channel Pacing Threshold Amplitude: 0.75 V
Lead Channel Pacing Threshold Pulse Width: 0.5 ms
Lead Channel Sensing Intrinsic Amplitude: 4.7 mV
MDC IDC MSMT BATTERY REMAINING LONGEVITY: 50 mo
MDC IDC MSMT BATTERY REMAINING PERCENTAGE: 51 %
MDC IDC MSMT BATTERY VOLTAGE: 2.96 V
MDC IDC MSMT LEADCHNL RV IMPEDANCE VALUE: 600 Ohm
MDC IDC MSMT LEADCHNL RV PACING THRESHOLD PULSEWIDTH: 0.5 ms
MDC IDC MSMT LEADCHNL RV SENSING INTR AMPL: 12 mV
MDC IDC PG IMPLANT DT: 20170331
MDC IDC SET LEADCHNL RA PACING AMPLITUDE: 2 V
MDC IDC SET LEADCHNL RV PACING AMPLITUDE: 2 V
MDC IDC SET LEADCHNL RV PACING PULSEWIDTH: 0.5 ms
MDC IDC SET LEADCHNL RV SENSING SENSITIVITY: 0.5 mV
MDC IDC STAT BRADY AP VP PERCENT: 15 %
MDC IDC STAT BRADY RV PERCENT PACED: 23 %
Pulse Gen Serial Number: 7112477

## 2018-02-05 ENCOUNTER — Ambulatory Visit (INDEPENDENT_AMBULATORY_CARE_PROVIDER_SITE_OTHER): Payer: Medicare Other | Admitting: Interventional Cardiology

## 2018-02-05 DIAGNOSIS — Z7901 Long term (current) use of anticoagulants: Secondary | ICD-10-CM | POA: Diagnosis not present

## 2018-02-05 DIAGNOSIS — Z952 Presence of prosthetic heart valve: Secondary | ICD-10-CM

## 2018-02-05 DIAGNOSIS — Z01810 Encounter for preprocedural cardiovascular examination: Secondary | ICD-10-CM

## 2018-02-05 LAB — POCT INR: INR: 3 (ref 2.0–3.0)

## 2018-02-05 NOTE — Patient Instructions (Signed)
Description   Spoke with Kennyth Lose (daughter) and instructed to continue taking 2mg  daily except 1.5mg  on Tuesdays, Thursdays and Sundays.  Recheck INR in 1 week.

## 2018-02-12 ENCOUNTER — Ambulatory Visit (INDEPENDENT_AMBULATORY_CARE_PROVIDER_SITE_OTHER): Payer: Medicare Other | Admitting: Pharmacist

## 2018-02-12 DIAGNOSIS — Z952 Presence of prosthetic heart valve: Secondary | ICD-10-CM

## 2018-02-12 DIAGNOSIS — Z7901 Long term (current) use of anticoagulants: Secondary | ICD-10-CM

## 2018-02-12 DIAGNOSIS — Z01810 Encounter for preprocedural cardiovascular examination: Secondary | ICD-10-CM

## 2018-02-12 LAB — POCT INR: INR: 3.6 — AB (ref 2.0–3.0)

## 2018-02-19 ENCOUNTER — Ambulatory Visit (INDEPENDENT_AMBULATORY_CARE_PROVIDER_SITE_OTHER): Payer: Medicare Other | Admitting: Cardiovascular Disease

## 2018-02-19 DIAGNOSIS — Z7901 Long term (current) use of anticoagulants: Secondary | ICD-10-CM | POA: Diagnosis not present

## 2018-02-19 DIAGNOSIS — Z952 Presence of prosthetic heart valve: Secondary | ICD-10-CM

## 2018-02-19 DIAGNOSIS — Z01810 Encounter for preprocedural cardiovascular examination: Secondary | ICD-10-CM

## 2018-02-19 LAB — POCT INR: INR: 4 — AB (ref 2.0–3.0)

## 2018-03-03 LAB — POCT INR: INR: 4.7 — AB (ref 2.0–3.0)

## 2018-03-04 ENCOUNTER — Ambulatory Visit (INDEPENDENT_AMBULATORY_CARE_PROVIDER_SITE_OTHER): Payer: Medicare Other | Admitting: Internal Medicine

## 2018-03-04 DIAGNOSIS — Z01810 Encounter for preprocedural cardiovascular examination: Secondary | ICD-10-CM

## 2018-03-04 DIAGNOSIS — Z952 Presence of prosthetic heart valve: Secondary | ICD-10-CM

## 2018-03-04 DIAGNOSIS — Z7901 Long term (current) use of anticoagulants: Secondary | ICD-10-CM

## 2018-03-11 ENCOUNTER — Ambulatory Visit (INDEPENDENT_AMBULATORY_CARE_PROVIDER_SITE_OTHER): Payer: Medicare Other | Admitting: Internal Medicine

## 2018-03-11 DIAGNOSIS — Z7901 Long term (current) use of anticoagulants: Secondary | ICD-10-CM | POA: Diagnosis not present

## 2018-03-11 DIAGNOSIS — Z952 Presence of prosthetic heart valve: Secondary | ICD-10-CM | POA: Diagnosis not present

## 2018-03-11 DIAGNOSIS — Z01810 Encounter for preprocedural cardiovascular examination: Secondary | ICD-10-CM

## 2018-03-11 LAB — POCT INR: INR: 2.5 (ref 2.0–3.0)

## 2018-03-18 ENCOUNTER — Ambulatory Visit (INDEPENDENT_AMBULATORY_CARE_PROVIDER_SITE_OTHER): Payer: Medicare Other | Admitting: Cardiovascular Disease

## 2018-03-18 DIAGNOSIS — Z952 Presence of prosthetic heart valve: Secondary | ICD-10-CM

## 2018-03-18 DIAGNOSIS — Z7901 Long term (current) use of anticoagulants: Secondary | ICD-10-CM

## 2018-03-18 DIAGNOSIS — Z01810 Encounter for preprocedural cardiovascular examination: Secondary | ICD-10-CM

## 2018-03-18 LAB — POCT INR: INR: 5.9 — AB (ref 2.0–3.0)

## 2018-03-26 ENCOUNTER — Ambulatory Visit (INDEPENDENT_AMBULATORY_CARE_PROVIDER_SITE_OTHER): Payer: Medicare Other | Admitting: Cardiovascular Disease

## 2018-03-26 DIAGNOSIS — Z7901 Long term (current) use of anticoagulants: Secondary | ICD-10-CM | POA: Diagnosis not present

## 2018-03-26 DIAGNOSIS — Z952 Presence of prosthetic heart valve: Secondary | ICD-10-CM

## 2018-03-26 DIAGNOSIS — Z01810 Encounter for preprocedural cardiovascular examination: Secondary | ICD-10-CM

## 2018-03-26 LAB — POCT INR: INR: 3.3 — AB (ref 2.0–3.0)

## 2018-04-02 ENCOUNTER — Ambulatory Visit (INDEPENDENT_AMBULATORY_CARE_PROVIDER_SITE_OTHER): Payer: Medicare Other | Admitting: Internal Medicine

## 2018-04-02 DIAGNOSIS — Z01810 Encounter for preprocedural cardiovascular examination: Secondary | ICD-10-CM

## 2018-04-02 DIAGNOSIS — Z5181 Encounter for therapeutic drug level monitoring: Secondary | ICD-10-CM

## 2018-04-02 DIAGNOSIS — Z952 Presence of prosthetic heart valve: Secondary | ICD-10-CM | POA: Diagnosis not present

## 2018-04-02 DIAGNOSIS — Z7901 Long term (current) use of anticoagulants: Secondary | ICD-10-CM

## 2018-04-02 LAB — POCT INR: INR: 2.8 (ref 2.0–3.0)

## 2018-04-09 ENCOUNTER — Ambulatory Visit (INDEPENDENT_AMBULATORY_CARE_PROVIDER_SITE_OTHER): Payer: Medicare Other | Admitting: Cardiology

## 2018-04-09 DIAGNOSIS — Z7901 Long term (current) use of anticoagulants: Secondary | ICD-10-CM

## 2018-04-09 DIAGNOSIS — Z952 Presence of prosthetic heart valve: Secondary | ICD-10-CM

## 2018-04-09 DIAGNOSIS — Z01810 Encounter for preprocedural cardiovascular examination: Secondary | ICD-10-CM

## 2018-04-09 LAB — POCT INR: INR: 6.5 — AB (ref 2.0–3.0)

## 2018-04-09 NOTE — Patient Instructions (Signed)
Description   Spoke with Kennyth Lose (daughter) and instructed pt to skip his Coumadin for 3 days, then continue same dose to 1.5mg  daily except 2mg  on Mondays.  Recheck INR in 5 days.

## 2018-04-10 ENCOUNTER — Telehealth: Payer: Self-pay | Admitting: *Deleted

## 2018-04-10 NOTE — Telephone Encounter (Addendum)
Geni Bers, pt's dtr called to inform us that she rechecked the pt's INR and it was 4.5 today; INR was checked on yesterday and INR was 6.5 at that time & instructions were given to hold 3 days.  Instructed dtr to have pt hold today's dose and have some green leafy intake today, too, then resume normal dose of 1.5mg  daily except 2mg  on Mondays. She will be out of town this weekend so she will not check any earlier than the expected date on 04/14/18.

## 2018-04-13 ENCOUNTER — Ambulatory Visit (INDEPENDENT_AMBULATORY_CARE_PROVIDER_SITE_OTHER): Payer: Medicare Other | Admitting: *Deleted

## 2018-04-13 DIAGNOSIS — I5022 Chronic systolic (congestive) heart failure: Secondary | ICD-10-CM | POA: Diagnosis not present

## 2018-04-14 ENCOUNTER — Ambulatory Visit (INDEPENDENT_AMBULATORY_CARE_PROVIDER_SITE_OTHER): Payer: Medicare Other | Admitting: Cardiology

## 2018-04-14 DIAGNOSIS — Z952 Presence of prosthetic heart valve: Secondary | ICD-10-CM | POA: Diagnosis not present

## 2018-04-14 DIAGNOSIS — Z01810 Encounter for preprocedural cardiovascular examination: Secondary | ICD-10-CM

## 2018-04-14 DIAGNOSIS — Z7901 Long term (current) use of anticoagulants: Secondary | ICD-10-CM | POA: Diagnosis not present

## 2018-04-14 LAB — POCT INR: INR: 4 — AB (ref 2.0–3.0)

## 2018-04-14 NOTE — Progress Notes (Signed)
Remote ICD transmission.   

## 2018-04-20 ENCOUNTER — Ambulatory Visit (INDEPENDENT_AMBULATORY_CARE_PROVIDER_SITE_OTHER): Payer: Medicare Other

## 2018-04-20 DIAGNOSIS — Z952 Presence of prosthetic heart valve: Secondary | ICD-10-CM | POA: Diagnosis not present

## 2018-04-20 DIAGNOSIS — Z7901 Long term (current) use of anticoagulants: Secondary | ICD-10-CM

## 2018-04-20 DIAGNOSIS — Z01810 Encounter for preprocedural cardiovascular examination: Secondary | ICD-10-CM | POA: Diagnosis not present

## 2018-04-20 LAB — POCT INR: INR: 2.8 (ref 2.0–3.0)

## 2018-04-20 NOTE — Patient Instructions (Signed)
Description   Spoke with Kennyth Lose (daughter) and instructed pt to continue on same dosage 1.5mg  daily except 1mg  on Saturdays.  Recheck INR in 1 week.

## 2018-04-27 ENCOUNTER — Ambulatory Visit (INDEPENDENT_AMBULATORY_CARE_PROVIDER_SITE_OTHER): Payer: Medicare Other

## 2018-04-27 DIAGNOSIS — Z01810 Encounter for preprocedural cardiovascular examination: Secondary | ICD-10-CM

## 2018-04-27 DIAGNOSIS — Z952 Presence of prosthetic heart valve: Secondary | ICD-10-CM

## 2018-04-27 DIAGNOSIS — Z7901 Long term (current) use of anticoagulants: Secondary | ICD-10-CM | POA: Diagnosis not present

## 2018-04-27 LAB — POCT INR: INR: 3 (ref 2.0–3.0)

## 2018-04-27 NOTE — Patient Instructions (Signed)
Description   Spoke with Kennyth Lose (daughter) and instructed pt to continue on same dosage 1.5mg  daily except 1mg  on Saturdays.  Recheck INR in 1 week (daughter prefers to check weekly).

## 2018-05-04 ENCOUNTER — Ambulatory Visit (INDEPENDENT_AMBULATORY_CARE_PROVIDER_SITE_OTHER): Payer: Medicare Other | Admitting: Internal Medicine

## 2018-05-04 DIAGNOSIS — Z952 Presence of prosthetic heart valve: Secondary | ICD-10-CM | POA: Diagnosis not present

## 2018-05-04 DIAGNOSIS — Z5181 Encounter for therapeutic drug level monitoring: Secondary | ICD-10-CM

## 2018-05-04 DIAGNOSIS — Z01810 Encounter for preprocedural cardiovascular examination: Secondary | ICD-10-CM

## 2018-05-04 DIAGNOSIS — Z7901 Long term (current) use of anticoagulants: Secondary | ICD-10-CM

## 2018-05-04 LAB — POCT INR: INR: 3.5 — AB (ref 2.0–3.0)

## 2018-05-04 NOTE — Patient Instructions (Signed)
Description   Spoke with Kennyth Lose (daughter) and instructed pt to continue on same dosage 1.5mg  daily except 1mg  on Saturdays.  Recheck INR in 1 week (daughter prefers to check weekly).

## 2018-05-07 LAB — CUP PACEART REMOTE DEVICE CHECK
Battery Remaining Longevity: 47 mo
Battery Remaining Percentage: 48 %
Battery Voltage: 2.95 V
Brady Statistic AP VP Percent: 24 %
Brady Statistic AP VS Percent: 55 %
Brady Statistic AS VS Percent: 18 %
Brady Statistic RV Percent Paced: 31 %
HIGH POWER IMPEDANCE MEASURED VALUE: 73 Ohm
HighPow Impedance: 73 Ohm
Implantable Lead Implant Date: 20140414
Implantable Lead Location: 753859
Implantable Pulse Generator Implant Date: 20170331
Lead Channel Impedance Value: 580 Ohm
Lead Channel Pacing Threshold Amplitude: 0.5 V
Lead Channel Pacing Threshold Pulse Width: 0.5 ms
Lead Channel Sensing Intrinsic Amplitude: 12 mV
Lead Channel Setting Pacing Amplitude: 2 V
Lead Channel Setting Pacing Amplitude: 2 V
Lead Channel Setting Pacing Pulse Width: 0.5 ms
MDC IDC LEAD IMPLANT DT: 20140414
MDC IDC LEAD LOCATION: 753860
MDC IDC MSMT LEADCHNL RA IMPEDANCE VALUE: 400 Ohm
MDC IDC MSMT LEADCHNL RA PACING THRESHOLD AMPLITUDE: 0.625 V
MDC IDC MSMT LEADCHNL RA PACING THRESHOLD PULSEWIDTH: 0.5 ms
MDC IDC MSMT LEADCHNL RA SENSING INTR AMPL: 3.1 mV
MDC IDC PG SERIAL: 7112477
MDC IDC SESS DTM: 20190929060016
MDC IDC SET LEADCHNL RV SENSING SENSITIVITY: 0.5 mV
MDC IDC STAT BRADY AS VP PERCENT: 2.2 %
MDC IDC STAT BRADY RA PERCENT PACED: 70 %

## 2018-05-11 ENCOUNTER — Ambulatory Visit (INDEPENDENT_AMBULATORY_CARE_PROVIDER_SITE_OTHER): Payer: Medicare Other

## 2018-05-11 DIAGNOSIS — Z5181 Encounter for therapeutic drug level monitoring: Secondary | ICD-10-CM | POA: Diagnosis not present

## 2018-05-11 DIAGNOSIS — Z01810 Encounter for preprocedural cardiovascular examination: Secondary | ICD-10-CM

## 2018-05-11 DIAGNOSIS — Z7901 Long term (current) use of anticoagulants: Secondary | ICD-10-CM

## 2018-05-11 DIAGNOSIS — Z952 Presence of prosthetic heart valve: Secondary | ICD-10-CM | POA: Diagnosis not present

## 2018-05-11 LAB — POCT INR: INR: 4.2 — AB (ref 2.0–3.0)

## 2018-05-11 NOTE — Patient Instructions (Signed)
Description   Spoke with Kennyth Lose (daughter) and instructed pt to continue on same dosage 1.5mg  daily except 1mg  on Saturdays.  Recheck INR in 1 week (daughter prefers to check weekly).

## 2018-05-13 ENCOUNTER — Telehealth: Payer: Self-pay | Admitting: Cardiology

## 2018-05-13 DIAGNOSIS — I351 Nonrheumatic aortic (valve) insufficiency: Secondary | ICD-10-CM | POA: Diagnosis not present

## 2018-05-13 DIAGNOSIS — E119 Type 2 diabetes mellitus without complications: Secondary | ICD-10-CM

## 2018-05-13 DIAGNOSIS — I10 Essential (primary) hypertension: Secondary | ICD-10-CM

## 2018-05-13 DIAGNOSIS — I2510 Atherosclerotic heart disease of native coronary artery without angina pectoris: Secondary | ICD-10-CM

## 2018-05-13 DIAGNOSIS — I340 Nonrheumatic mitral (valve) insufficiency: Secondary | ICD-10-CM | POA: Diagnosis not present

## 2018-05-13 DIAGNOSIS — I509 Heart failure, unspecified: Secondary | ICD-10-CM

## 2018-05-13 DIAGNOSIS — N189 Chronic kidney disease, unspecified: Secondary | ICD-10-CM | POA: Diagnosis not present

## 2018-05-13 DIAGNOSIS — I517 Cardiomegaly: Secondary | ICD-10-CM | POA: Diagnosis not present

## 2018-05-13 DIAGNOSIS — D689 Coagulation defect, unspecified: Secondary | ICD-10-CM | POA: Diagnosis not present

## 2018-05-13 DIAGNOSIS — E039 Hypothyroidism, unspecified: Secondary | ICD-10-CM

## 2018-05-13 DIAGNOSIS — J449 Chronic obstructive pulmonary disease, unspecified: Secondary | ICD-10-CM

## 2018-05-13 DIAGNOSIS — E785 Hyperlipidemia, unspecified: Secondary | ICD-10-CM

## 2018-05-13 DIAGNOSIS — I371 Nonrheumatic pulmonary valve insufficiency: Secondary | ICD-10-CM | POA: Diagnosis not present

## 2018-05-13 DIAGNOSIS — R7989 Other specified abnormal findings of blood chemistry: Secondary | ICD-10-CM | POA: Diagnosis not present

## 2018-05-13 NOTE — Telephone Encounter (Signed)
Has questions about needing a pacemaker check

## 2018-05-14 DIAGNOSIS — I429 Cardiomyopathy, unspecified: Secondary | ICD-10-CM | POA: Diagnosis not present

## 2018-05-14 DIAGNOSIS — K8020 Calculus of gallbladder without cholecystitis without obstruction: Secondary | ICD-10-CM

## 2018-05-14 DIAGNOSIS — R7989 Other specified abnormal findings of blood chemistry: Secondary | ICD-10-CM

## 2018-05-14 DIAGNOSIS — I2510 Atherosclerotic heart disease of native coronary artery without angina pectoris: Secondary | ICD-10-CM | POA: Diagnosis not present

## 2018-05-14 NOTE — Telephone Encounter (Signed)
Patient's daughter, Geni Bers, called to let Dr. Bettina Gavia know that the patient was admitted to [facility] yesterday after having a heart attack. He is in the ICU with cardiology following him. The doctors have reported that he is not a candidate for transfer to [facility] for intervention. Geni Bers just wanted to make Dr. Bettina Gavia aware.

## 2018-05-15 DIAGNOSIS — R7989 Other specified abnormal findings of blood chemistry: Secondary | ICD-10-CM | POA: Diagnosis not present

## 2018-05-15 DIAGNOSIS — I2510 Atherosclerotic heart disease of native coronary artery without angina pectoris: Secondary | ICD-10-CM | POA: Diagnosis not present

## 2018-05-15 DIAGNOSIS — I429 Cardiomyopathy, unspecified: Secondary | ICD-10-CM | POA: Diagnosis not present

## 2018-05-15 DIAGNOSIS — K8020 Calculus of gallbladder without cholecystitis without obstruction: Secondary | ICD-10-CM | POA: Diagnosis not present

## 2018-05-16 DIAGNOSIS — K8020 Calculus of gallbladder without cholecystitis without obstruction: Secondary | ICD-10-CM | POA: Diagnosis not present

## 2018-05-16 DIAGNOSIS — R7989 Other specified abnormal findings of blood chemistry: Secondary | ICD-10-CM | POA: Diagnosis not present

## 2018-05-16 DIAGNOSIS — I429 Cardiomyopathy, unspecified: Secondary | ICD-10-CM | POA: Diagnosis not present

## 2018-05-16 DIAGNOSIS — I2510 Atherosclerotic heart disease of native coronary artery without angina pectoris: Secondary | ICD-10-CM | POA: Diagnosis not present

## 2018-05-17 DIAGNOSIS — K8020 Calculus of gallbladder without cholecystitis without obstruction: Secondary | ICD-10-CM | POA: Diagnosis not present

## 2018-05-17 DIAGNOSIS — R7989 Other specified abnormal findings of blood chemistry: Secondary | ICD-10-CM | POA: Diagnosis not present

## 2018-05-18 ENCOUNTER — Ambulatory Visit: Payer: Medicare Other | Admitting: Cardiology

## 2018-05-25 ENCOUNTER — Telehealth: Payer: Self-pay | Admitting: Cardiology

## 2018-05-25 NOTE — Telephone Encounter (Signed)
Spoke w/ pt daughter and she informed me that pt passed away on Oct 23, 2022.

## 2018-06-14 DEATH — deceased

## 2018-08-28 IMAGING — DX DG LUMBAR SPINE COMPLETE 4+V
5 series · 5 of 5 positions shown · non-contrast
Comparison: None.

CLINICAL DATA: Pain without trauma

EXAM:
LUMBAR SPINE - COMPLETE 4+ VIEW

[l-spine ap]
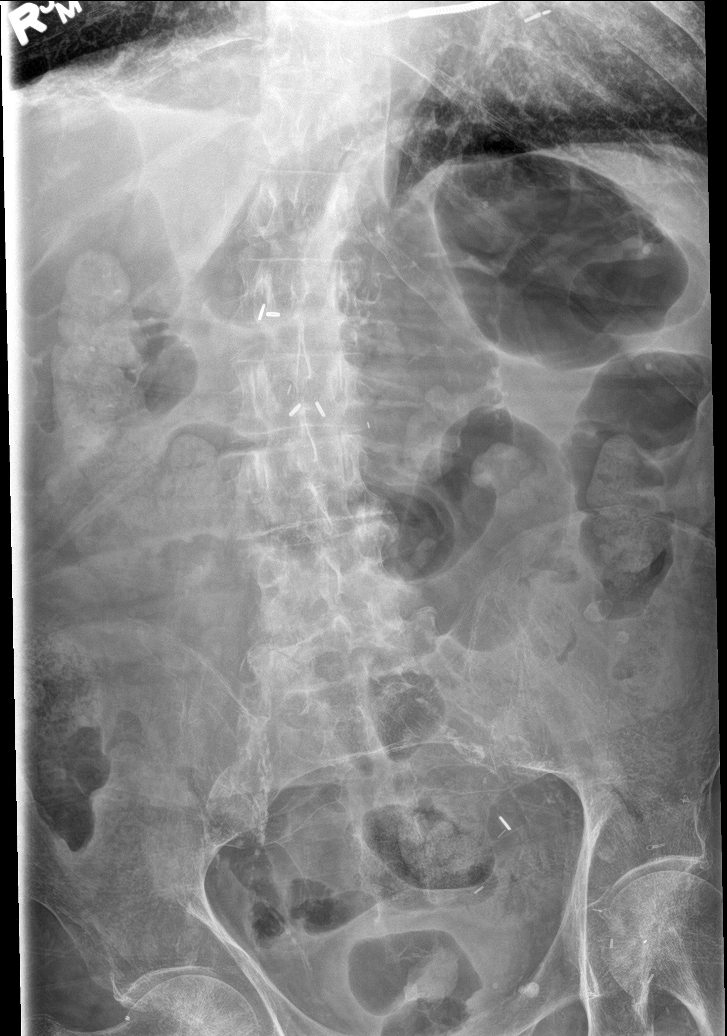

[l-spine obl (1 of 2)]
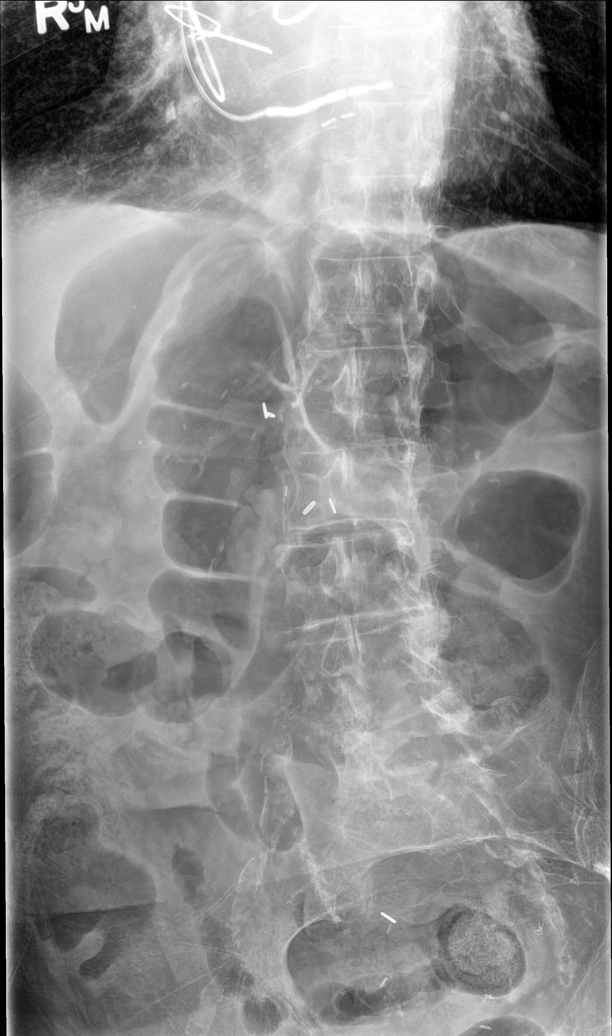

[l-spine obl (2 of 2)]
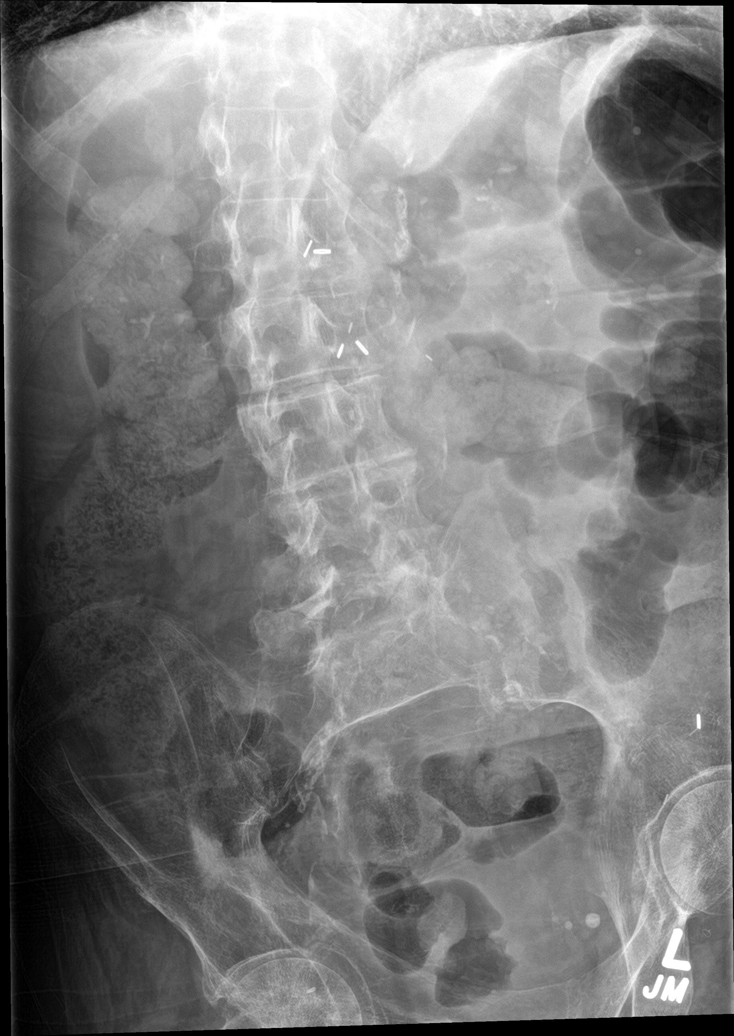

[l-spine lat]
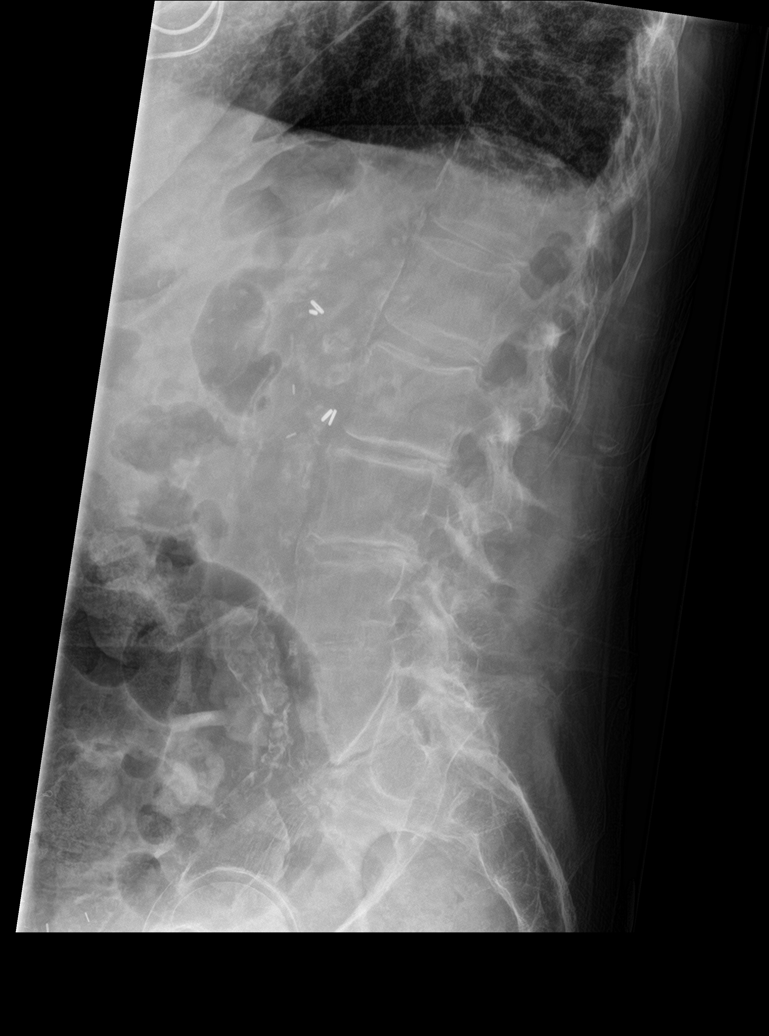

[l-spine spot]
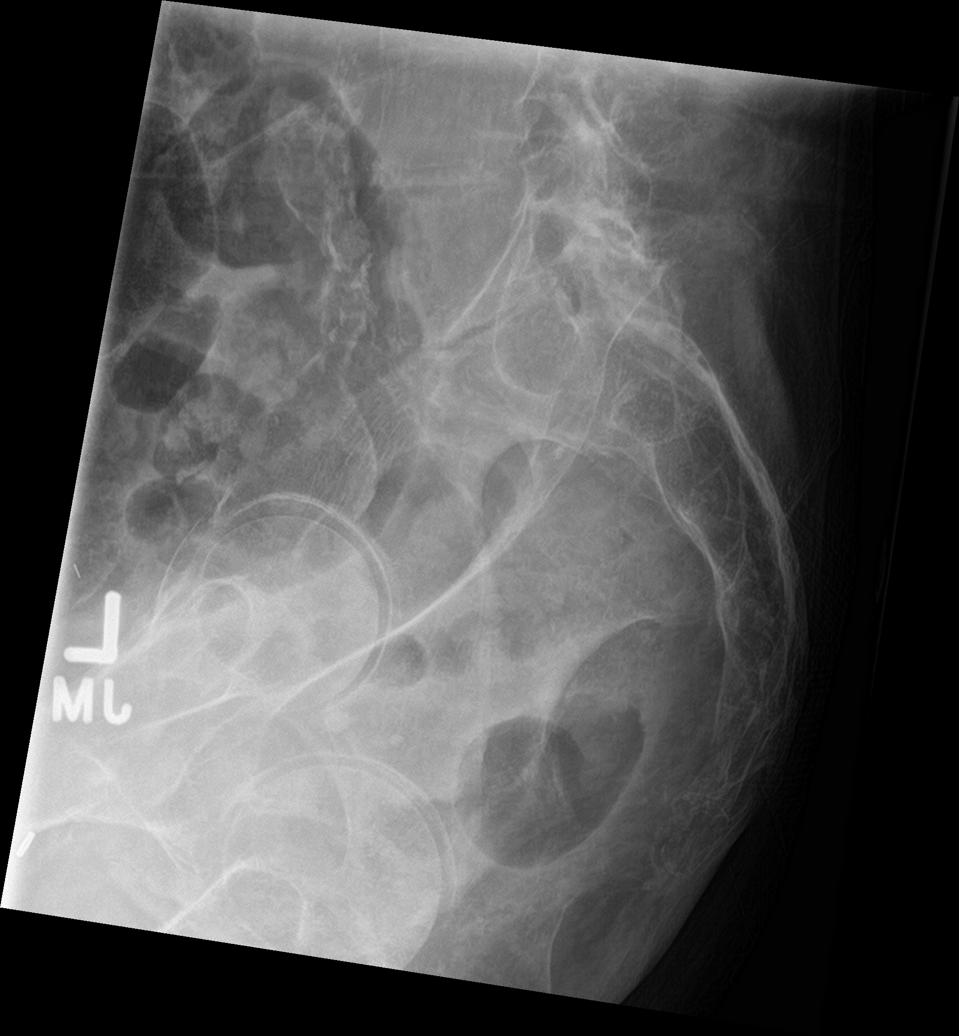

[5 of 5 positions shown; findings below may reference images not displayed]

FINDINGS: Multilevel degenerative changes with no identified fractures.
IMPRESSION: Multilevel degenerative changes with no identified fractures.
